# Patient Record
Sex: Female | Born: 1960 | Race: Black or African American | Hispanic: No | Smoking: Current every day smoker
Health system: Southern US, Community
[De-identification: ages and names within clinical notes are randomized; demographics above are authoritative.]

## PROBLEM LIST (undated history)

## (undated) DIAGNOSIS — F419 Anxiety disorder, unspecified: Secondary | ICD-10-CM

## (undated) DIAGNOSIS — I639 Cerebral infarction, unspecified: Secondary | ICD-10-CM

## (undated) DIAGNOSIS — I10 Essential (primary) hypertension: Secondary | ICD-10-CM

## (undated) HISTORY — PX: FRACTURE SURGERY: SHX138

## (undated) HISTORY — PX: TUBAL LIGATION: SHX77

---

## 1998-09-28 ENCOUNTER — Emergency Department (HOSPITAL_COMMUNITY): Admission: EM | Admit: 1998-09-28 | Discharge: 1998-09-28 | Payer: Self-pay | Admitting: Emergency Medicine

## 1998-11-18 ENCOUNTER — Emergency Department (HOSPITAL_COMMUNITY): Admission: EM | Admit: 1998-11-18 | Discharge: 1998-11-18 | Payer: Self-pay | Admitting: Emergency Medicine

## 1998-12-26 ENCOUNTER — Emergency Department (HOSPITAL_COMMUNITY): Admission: EM | Admit: 1998-12-26 | Discharge: 1998-12-26 | Payer: Self-pay | Admitting: Emergency Medicine

## 1999-02-16 ENCOUNTER — Emergency Department (HOSPITAL_COMMUNITY): Admission: EM | Admit: 1999-02-16 | Discharge: 1999-02-16 | Payer: Self-pay | Admitting: Emergency Medicine

## 1999-06-15 ENCOUNTER — Emergency Department (HOSPITAL_COMMUNITY): Admission: EM | Admit: 1999-06-15 | Discharge: 1999-06-15 | Payer: Self-pay | Admitting: Emergency Medicine

## 1999-08-30 ENCOUNTER — Emergency Department (HOSPITAL_COMMUNITY): Admission: EM | Admit: 1999-08-30 | Discharge: 1999-08-30 | Payer: Self-pay | Admitting: Emergency Medicine

## 1999-11-27 ENCOUNTER — Emergency Department (HOSPITAL_COMMUNITY): Admission: EM | Admit: 1999-11-27 | Discharge: 1999-11-27 | Payer: Self-pay | Admitting: *Deleted

## 1999-11-28 ENCOUNTER — Emergency Department (HOSPITAL_COMMUNITY): Admission: EM | Admit: 1999-11-28 | Discharge: 1999-11-28 | Payer: Self-pay | Admitting: Emergency Medicine

## 2000-03-05 ENCOUNTER — Emergency Department (HOSPITAL_COMMUNITY): Admission: EM | Admit: 2000-03-05 | Discharge: 2000-03-05 | Payer: Self-pay | Admitting: Emergency Medicine

## 2000-03-05 ENCOUNTER — Encounter: Payer: Self-pay | Admitting: Emergency Medicine

## 2000-05-03 ENCOUNTER — Emergency Department (HOSPITAL_COMMUNITY): Admission: EM | Admit: 2000-05-03 | Discharge: 2000-05-03 | Payer: Self-pay | Admitting: Emergency Medicine

## 2001-01-16 ENCOUNTER — Other Ambulatory Visit: Admission: RE | Admit: 2001-01-16 | Discharge: 2001-01-16 | Payer: Self-pay | Admitting: Podiatry

## 2001-09-20 ENCOUNTER — Emergency Department (HOSPITAL_COMMUNITY): Admission: EM | Admit: 2001-09-20 | Discharge: 2001-09-20 | Payer: Self-pay

## 2001-09-22 ENCOUNTER — Emergency Department (HOSPITAL_COMMUNITY): Admission: EM | Admit: 2001-09-22 | Discharge: 2001-09-22 | Payer: Self-pay | Admitting: Emergency Medicine

## 2001-11-11 ENCOUNTER — Emergency Department (HOSPITAL_COMMUNITY): Admission: EM | Admit: 2001-11-11 | Discharge: 2001-11-11 | Payer: Self-pay | Admitting: Emergency Medicine

## 2002-02-16 ENCOUNTER — Emergency Department (HOSPITAL_COMMUNITY): Admission: EM | Admit: 2002-02-16 | Discharge: 2002-02-16 | Payer: Self-pay | Admitting: Emergency Medicine

## 2002-06-13 ENCOUNTER — Emergency Department (HOSPITAL_COMMUNITY): Admission: EM | Admit: 2002-06-13 | Discharge: 2002-06-13 | Payer: Self-pay | Admitting: Emergency Medicine

## 2002-06-13 ENCOUNTER — Encounter: Payer: Self-pay | Admitting: *Deleted

## 2002-10-02 ENCOUNTER — Encounter: Payer: Self-pay | Admitting: Obstetrics

## 2002-10-02 ENCOUNTER — Ambulatory Visit (HOSPITAL_COMMUNITY): Admission: RE | Admit: 2002-10-02 | Discharge: 2002-10-02 | Payer: Self-pay | Admitting: Obstetrics

## 2002-11-10 ENCOUNTER — Emergency Department (HOSPITAL_COMMUNITY): Admission: EM | Admit: 2002-11-10 | Discharge: 2002-11-10 | Payer: Self-pay

## 2003-04-14 ENCOUNTER — Emergency Department (HOSPITAL_COMMUNITY): Admission: EM | Admit: 2003-04-14 | Discharge: 2003-04-14 | Payer: Self-pay | Admitting: Emergency Medicine

## 2003-05-20 ENCOUNTER — Emergency Department (HOSPITAL_COMMUNITY): Admission: EM | Admit: 2003-05-20 | Discharge: 2003-05-20 | Payer: Self-pay | Admitting: Emergency Medicine

## 2003-10-20 ENCOUNTER — Emergency Department (HOSPITAL_COMMUNITY): Admission: EM | Admit: 2003-10-20 | Discharge: 2003-10-20 | Payer: Self-pay | Admitting: Emergency Medicine

## 2003-11-29 ENCOUNTER — Emergency Department (HOSPITAL_COMMUNITY): Admission: EM | Admit: 2003-11-29 | Discharge: 2003-11-29 | Payer: Self-pay | Admitting: Emergency Medicine

## 2004-01-17 ENCOUNTER — Emergency Department (HOSPITAL_COMMUNITY): Admission: EM | Admit: 2004-01-17 | Discharge: 2004-01-17 | Payer: Self-pay | Admitting: Emergency Medicine

## 2004-03-22 ENCOUNTER — Emergency Department (HOSPITAL_COMMUNITY): Admission: EM | Admit: 2004-03-22 | Discharge: 2004-03-22 | Payer: Self-pay | Admitting: Emergency Medicine

## 2004-10-19 ENCOUNTER — Emergency Department (HOSPITAL_COMMUNITY): Admission: EM | Admit: 2004-10-19 | Discharge: 2004-10-19 | Payer: Self-pay | Admitting: *Deleted

## 2004-12-14 ENCOUNTER — Emergency Department (HOSPITAL_COMMUNITY): Admission: EM | Admit: 2004-12-14 | Discharge: 2004-12-14 | Payer: Self-pay | Admitting: Emergency Medicine

## 2006-07-30 ENCOUNTER — Emergency Department (HOSPITAL_COMMUNITY): Admission: EM | Admit: 2006-07-30 | Discharge: 2006-07-30 | Payer: Self-pay | Admitting: Emergency Medicine

## 2006-08-20 ENCOUNTER — Emergency Department (HOSPITAL_COMMUNITY): Admission: EM | Admit: 2006-08-20 | Discharge: 2006-08-21 | Payer: Self-pay | Admitting: Emergency Medicine

## 2006-08-23 ENCOUNTER — Emergency Department (HOSPITAL_COMMUNITY): Admission: EM | Admit: 2006-08-23 | Discharge: 2006-08-23 | Payer: Self-pay | Admitting: Family Medicine

## 2006-08-24 ENCOUNTER — Emergency Department (HOSPITAL_COMMUNITY): Admission: EM | Admit: 2006-08-24 | Discharge: 2006-08-24 | Payer: Self-pay | Admitting: Emergency Medicine

## 2006-09-26 ENCOUNTER — Emergency Department (HOSPITAL_COMMUNITY): Admission: EM | Admit: 2006-09-26 | Discharge: 2006-09-26 | Payer: Self-pay | Admitting: Emergency Medicine

## 2006-10-04 ENCOUNTER — Emergency Department (HOSPITAL_COMMUNITY): Admission: EM | Admit: 2006-10-04 | Discharge: 2006-10-04 | Payer: Self-pay | Admitting: Emergency Medicine

## 2006-10-16 ENCOUNTER — Ambulatory Visit: Payer: Self-pay | Admitting: Psychiatry

## 2006-10-17 ENCOUNTER — Inpatient Hospital Stay (HOSPITAL_COMMUNITY): Admission: RE | Admit: 2006-10-17 | Discharge: 2006-10-18 | Payer: Self-pay | Admitting: Psychiatry

## 2006-11-11 ENCOUNTER — Emergency Department (HOSPITAL_COMMUNITY): Admission: EM | Admit: 2006-11-11 | Discharge: 2006-11-12 | Payer: Self-pay | Admitting: Emergency Medicine

## 2006-11-15 ENCOUNTER — Emergency Department (HOSPITAL_COMMUNITY): Admission: EM | Admit: 2006-11-15 | Discharge: 2006-11-15 | Payer: Self-pay | Admitting: Emergency Medicine

## 2006-12-18 ENCOUNTER — Emergency Department (HOSPITAL_COMMUNITY): Admission: EM | Admit: 2006-12-18 | Discharge: 2006-12-18 | Payer: Self-pay | Admitting: Emergency Medicine

## 2007-01-16 ENCOUNTER — Encounter: Payer: Self-pay | Admitting: Emergency Medicine

## 2007-01-16 ENCOUNTER — Inpatient Hospital Stay (HOSPITAL_COMMUNITY): Admission: RE | Admit: 2007-01-16 | Discharge: 2007-01-24 | Payer: Self-pay | Admitting: Psychiatry

## 2007-01-16 ENCOUNTER — Ambulatory Visit: Payer: Self-pay | Admitting: Psychiatry

## 2007-02-22 ENCOUNTER — Emergency Department (HOSPITAL_COMMUNITY): Admission: EM | Admit: 2007-02-22 | Discharge: 2007-02-22 | Payer: Self-pay | Admitting: *Deleted

## 2007-03-08 ENCOUNTER — Emergency Department (HOSPITAL_COMMUNITY): Admission: EM | Admit: 2007-03-08 | Discharge: 2007-03-08 | Payer: Self-pay | Admitting: Emergency Medicine

## 2007-07-04 ENCOUNTER — Emergency Department (HOSPITAL_COMMUNITY): Admission: EM | Admit: 2007-07-04 | Discharge: 2007-07-04 | Payer: Self-pay | Admitting: *Deleted

## 2007-09-28 ENCOUNTER — Emergency Department (HOSPITAL_COMMUNITY): Admission: EM | Admit: 2007-09-28 | Discharge: 2007-09-28 | Payer: Self-pay | Admitting: Emergency Medicine

## 2007-10-29 ENCOUNTER — Emergency Department (HOSPITAL_COMMUNITY): Admission: EM | Admit: 2007-10-29 | Discharge: 2007-10-29 | Payer: Self-pay | Admitting: Emergency Medicine

## 2008-02-03 ENCOUNTER — Ambulatory Visit (HOSPITAL_COMMUNITY): Admission: RE | Admit: 2008-02-03 | Discharge: 2008-02-04 | Payer: Self-pay | Admitting: Orthopaedic Surgery

## 2008-04-10 ENCOUNTER — Emergency Department (HOSPITAL_COMMUNITY): Admission: EM | Admit: 2008-04-10 | Discharge: 2008-04-10 | Payer: Self-pay | Admitting: Emergency Medicine

## 2008-05-18 ENCOUNTER — Emergency Department (HOSPITAL_COMMUNITY): Admission: EM | Admit: 2008-05-18 | Discharge: 2008-05-18 | Payer: Self-pay | Admitting: Emergency Medicine

## 2008-06-06 IMAGING — CR DG CERVICAL SPINE COMPLETE 4+V
7 series · 7 of 7 positions shown · non-contrast
Comparison: none

CLINICAL DATA: Fall, back pain

CERVICAL SPINE - 5  VIEW:

[view not recorded (1 of 7)]
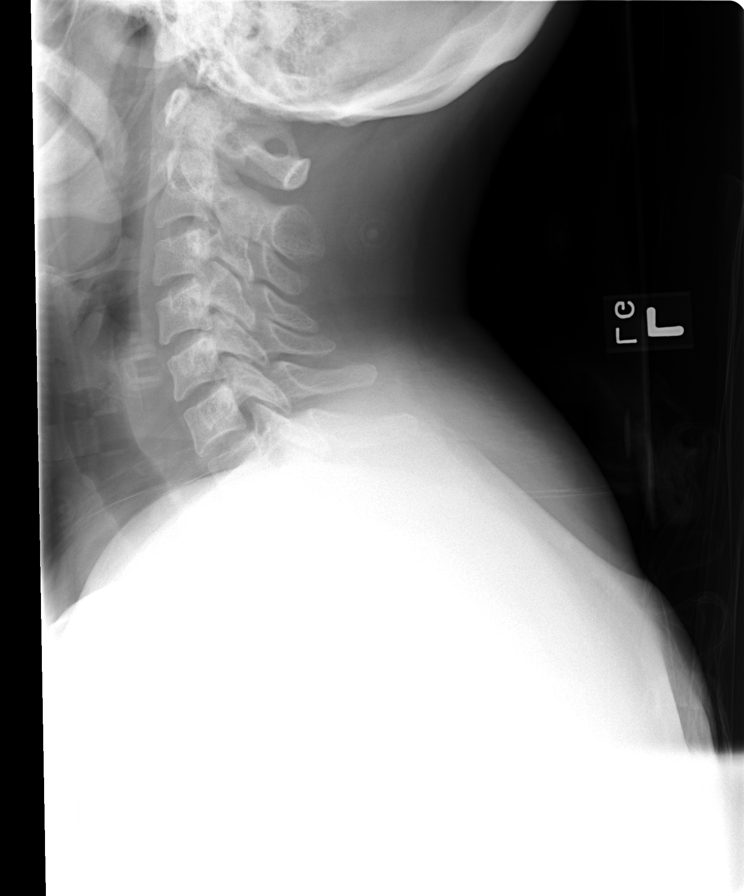

[view not recorded (2 of 7)]
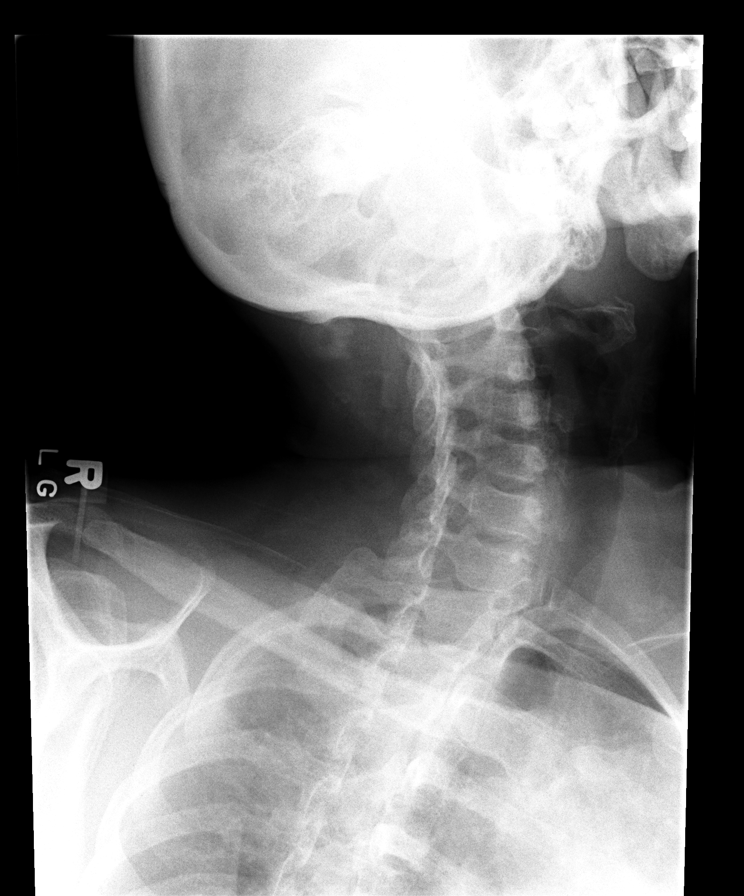

[view not recorded (3 of 7)]
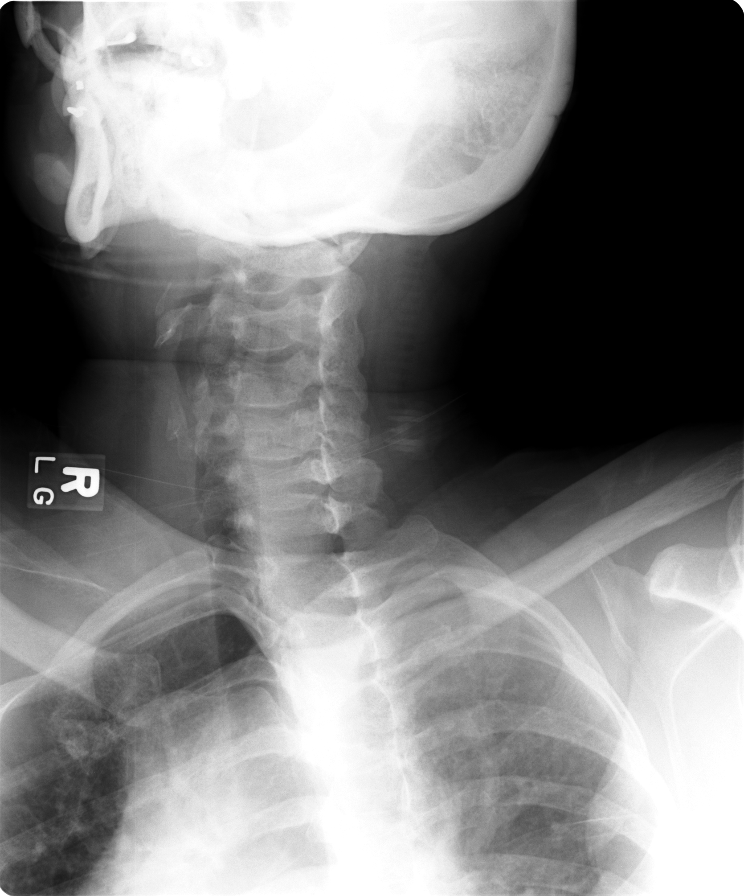

[view not recorded (4 of 7)]
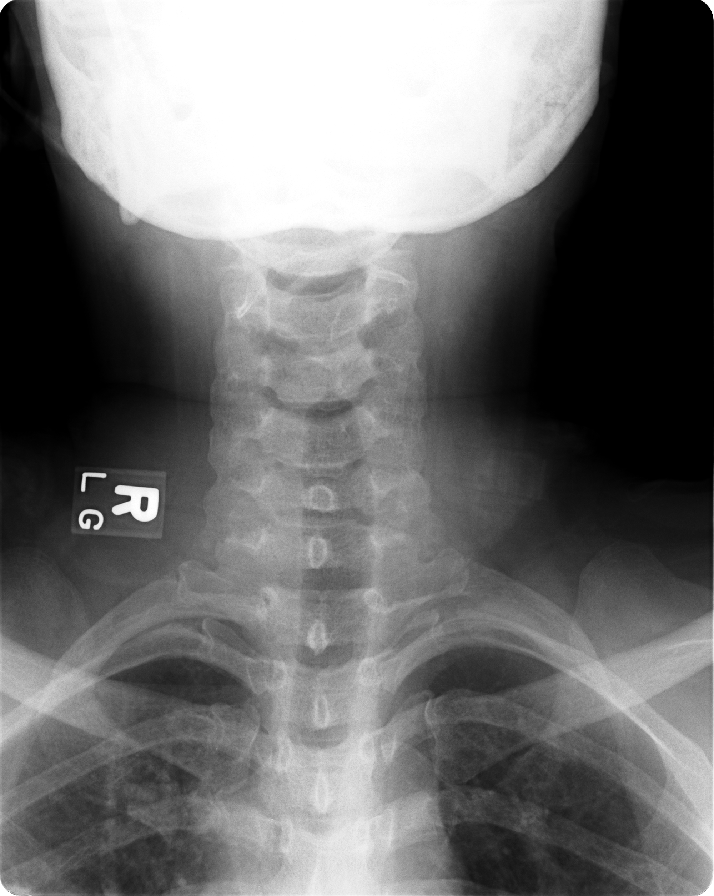

[view not recorded (5 of 7)]
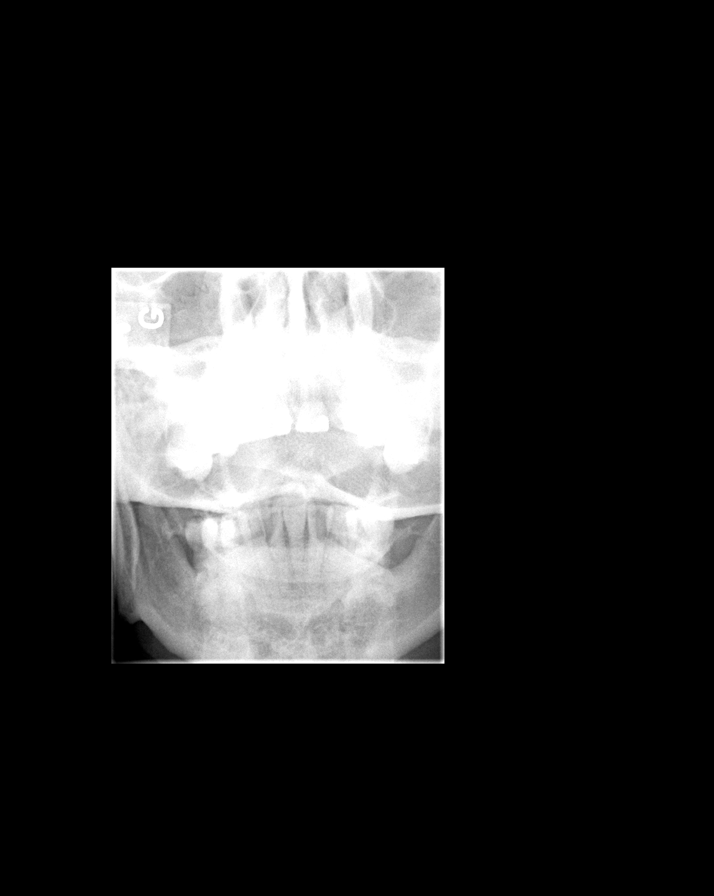

[view not recorded (6 of 7)]
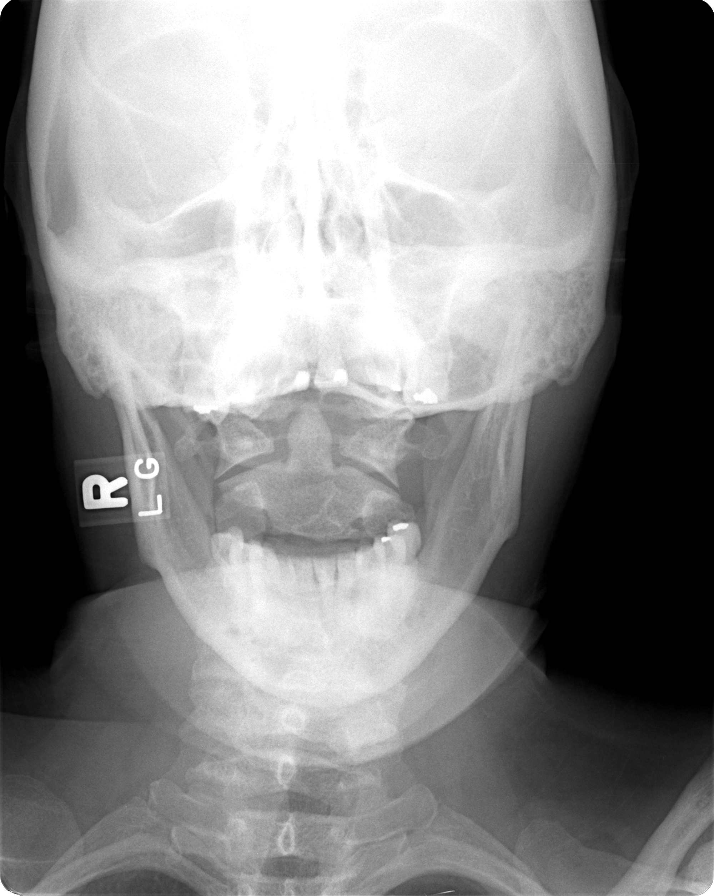

[view not recorded (7 of 7)]
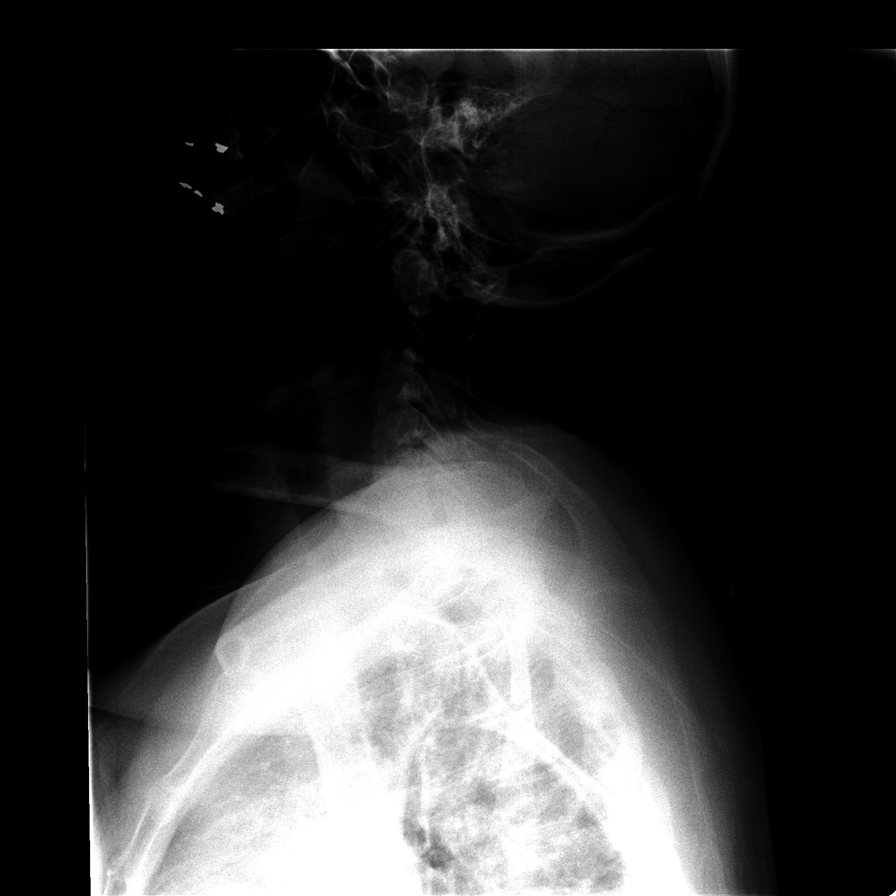

[7 of 7 positions shown; findings below may reference images not displayed]

FINDINGS: There is no evidence of cervical spine fracture or prevertebral soft
tissue swelling.  Alignment is normal.  No other significant bone abnormalities
are identified.
IMPRESSION: Negative cervical spine radiographs.

## 2008-08-23 IMAGING — CR DG CHEST 1V PORT
1 series · 1 of 1 positions shown · non-contrast
Comparison: 12/14/04.

CLINICAL DATA: Shortness of breath, chest pain.  
 PORTABLE CHEST - 1 VIEW ? 10/16/06 (2826 HOURS):

[view not recorded]
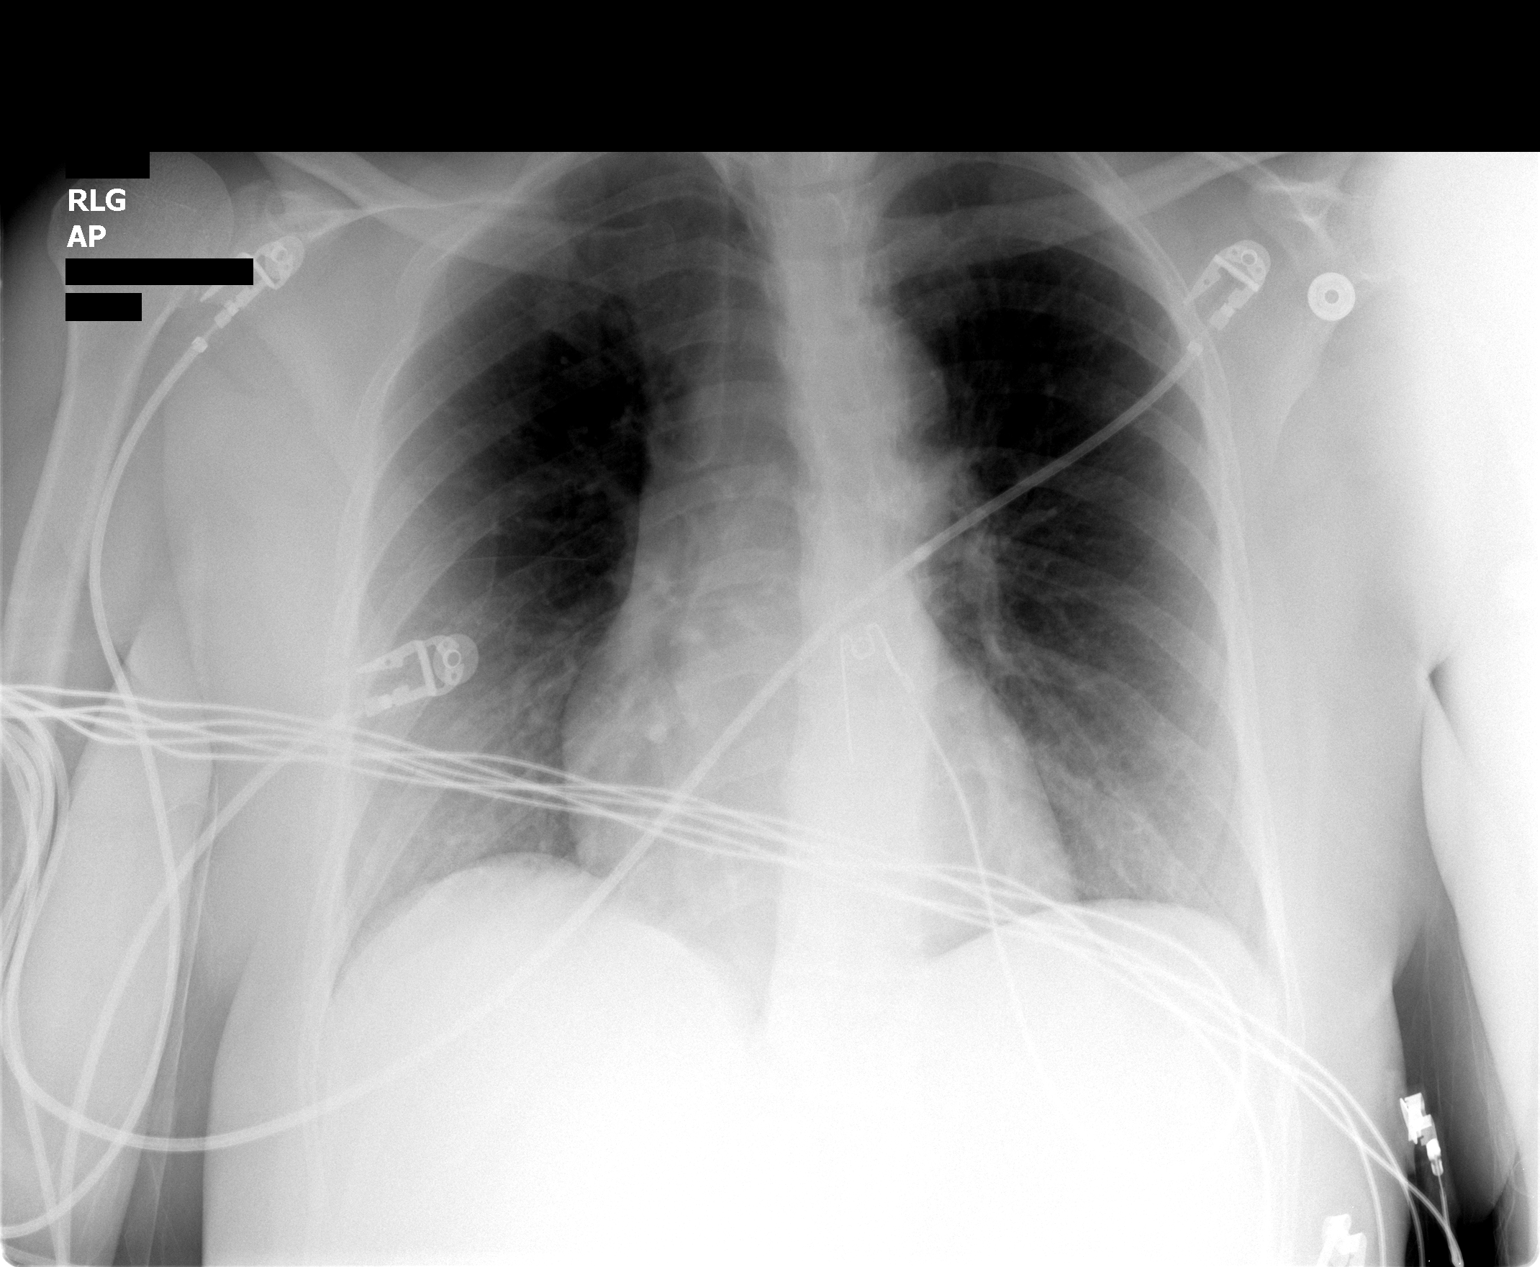

[1 of 1 positions shown; findings below may reference images not displayed]

FINDINGS: Heart size is normal.   There is no heart failure.  The lungs are clear and there is no infiltrate.
IMPRESSION: No acute abnormality.

## 2009-06-27 ENCOUNTER — Encounter: Admission: RE | Admit: 2009-06-27 | Discharge: 2009-06-27 | Payer: Self-pay | Admitting: Obstetrics and Gynecology

## 2009-11-13 ENCOUNTER — Emergency Department (HOSPITAL_COMMUNITY): Admission: EM | Admit: 2009-11-13 | Discharge: 2009-11-13 | Payer: Self-pay | Admitting: Emergency Medicine

## 2010-08-17 ENCOUNTER — Emergency Department (HOSPITAL_COMMUNITY)
Admission: EM | Admit: 2010-08-17 | Discharge: 2010-08-17 | Payer: Self-pay | Source: Home / Self Care | Admitting: Emergency Medicine

## 2010-10-02 ENCOUNTER — Encounter: Payer: Self-pay | Admitting: Internal Medicine

## 2011-01-07 ENCOUNTER — Emergency Department (HOSPITAL_COMMUNITY)
Admission: EM | Admit: 2011-01-07 | Discharge: 2011-01-07 | Disposition: A | Discharge status: Home / Self Care | Payer: Medicaid Other | Attending: Emergency Medicine | Admitting: Emergency Medicine

## 2011-01-07 DIAGNOSIS — F411 Generalized anxiety disorder: Secondary | ICD-10-CM | POA: Insufficient documentation

## 2011-01-07 DIAGNOSIS — I10 Essential (primary) hypertension: Secondary | ICD-10-CM | POA: Insufficient documentation

## 2011-01-23 NOTE — Discharge Summary (Signed)
Amanda Moody, MCCOLM                  ACCOUNT NO.:  [id number]   MEDICAL RECORD NO.:  [id number]          PATIENT TYPE:  IPS   LOCATION:  0504                          [facility]   PHYSICIAN:  Geoffery Lyons, M.D.      DATE OF BIRTH:  1961/02/26   DATE OF ADMISSION:  01/16/2007  DATE OF DISCHARGE:  01/24/2007                               DISCHARGE SUMMARY   CHIEF COMPLAINT/HISTORY OF PRESENT ILLNESS:  This is the second  admission to [facility] for this 50 year old African-  American female being voluntarily admitted.  She presented to the  emergency room after having slipped while stepping from the house at  home.  She fell and fractured her left forearm.  In the ED, she revealed  that she was feeling very depressed, suicidal and contemplating hurting  herself when she was thinking about the upcoming Mother's Day weekend.  She was depressed and ashamed of recently relapsing on heroin.  Snorted  one bag or two the day before after being abstinent for 14 days and  before that using two bags daily for four months.  She was uses some  alcohol rarely.  She feels hopeless about being able to maintain level  of abstinence.  She claimed that the relapse was in part triggered being  exposed to a lot of drug use where she lives.   The history of her second admission to [facility] was  that she last admitted October 17, 2006 through October 18, 2006 for  detox.  She endorsed a history of depressed mood and auditory  hallucinations upon this admission, having voices telling her that she  should go ahead and die.  She had taken Prozac 20 mg per day and took  Klonopin the distant past.  In the emergency room, she was given Geodon  20 mg IM.  She has also been on Risperdal.   ALCOHOL AND DRUG HISTORY:  She has a long history of opiate dependence  with history of detox in [place].  Her longest period of abstinence was  after detox in [place] which was 30 days.   She was most productive and  did the best on methadone maintenance.   MEDICAL HISTORY:  1. 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  2. High blood pressure.   MEDICATION:  1. Geodon 20 mg IM given in the ED.  2. Prozac 20 mg per day, recent.  3. Methadone between 50 and 100 mg per day in the various programs      when she was on methadone maintenance.   PHYSICAL EXAMINATION:  Compatible for a crusty lesion on the upper  auricle of her right ear and for fracture of left arm.   LABORATORY WORK:  SGOT 23, SGPT 24, total bilirubin 0.3.  TSH 1.018.  UDS positive for opiates and cocaine.  Sodium 136, potassium 4, BUN 12,  creatinine is not available.   NEUROLOGIC EXAMINATION:  This is a fully alert female who is cooperative  and pleasant whose mood is depressed and affect is depressed.  Her  speech is normal production pace and tone, somewhat soft and a little  slow but adequately articulate and fluent.  Her thought process endorses  suicidal ruminations, thinking about cutting herself and some voices  telling her to die.  Cognition is well-preserved.   AXIS I:  Major depression with psychotic features.  Opiate dependence.  Cocaine abuse.  AXIS II:  No diagnosis.  AXIS II:  Fractured left ulna, nonunion fracture.  External ear wound.  AXIS IV:  Moderate.  AXIS V:  Global assessment of functioning on admission was 35 and global  assessment of functioning last year was 60.   COURSE IN THE HOSPITAL:  She was admitted and started individual  psychotherapy.  She was given trazodone for sleep.  She was given some  Risperdal and some Ativan.  She was placed on Percocet due to the acute  pain and she was also given some Neurontin.  Her past medications  include Prozac, some methadone and Klonopin.  As already stated, she  endorsed that she had been on disability for bipolar disorder.  She has  two years in college but was unable to function and disappointed in  herself due to the use,  even avoiding house and people using.  She stays  with the mother on and off.  She cannot stay clean where she is at.  Abstinent for 14 days, she did relapsed on heroin two bags, snorting.  She is down on self, out of it and suicidal.  She did use some cocaine  and some alcohol, couple of drinks.  She endorses voices when she is  depressed.  The voices tell her to just kill herself.  Her past history  as already stated, previous detox __________ in [place].  She continued  with the pain.  She had a hard time with the nicotine withdrawal.  Her  mother was willing to allow her to stay in the house if she was in the  methadone program.   We worked on pain management and relapse prevention.  We also worked her  other comorbidities.  On 01/19/2007, she was having a hard time with  mood swings, mood lability, pain, very irritable, unable to deal with  group, easily agitated easily and easily overwhelmed.  She very labile.  She had some verbal altercations with a couple of peers and then  tearful, asking why people were mean.  She verbalized __________ of the  mood swings.  She was hearing voices in the morning.   On 01/20/2007, she had a very hard time with the pain, with the mood  swings and the anxiety.  She was having a hard time tolerating other  patients' behavior.  She endorsed she was tired of feeling this way and  wanted to change thins around.  She wanted to feel better.  She was  upset that her life has not turned out the way she expected it to be.  She continued to have the mood swings and anxiety, building up to  hyperventilating but did respond to intervention from staff.  We  increased the Neurontin.  We worked on Pharmacologist, __________  prevention.  We worked with some Seroquel.  She experienced anxiety and  the mood fluctuation and lability.  She was having a hard time.  She claimed that the staff was not giving her pain pills sometimes.  She was  very irritable.  At the  same time, she was able to commit to working on  her recovery.  She could not tolerate delays of the nursing staff giving  her medications.   By 01/24/2007, she was better.  She had tolerated the medication well  and seemed to be helping.  Her mood was more euthymic and affect was  more contained, within normal limits.  There was some marked decrease in  the lability.  She was not suicidal or homicidal and was not hearing  voices, so she was discharged for outpatient followup.   AXIS I:  Mood disorder, not otherwise specified, with psychotic  features.  Opiate dependence.  Cocaine abuse.  Alcohol abuse.  AXIS II:  No diagnosis.  AXIS III:  Fractured left ulnar bone.  AXIS IV:  Moderate.  AXIS V:  On discharge, 50.   DISCHARGE MEDICATIONS:  1. Prozac 20 mg per day.  2. Seroquel 100 three times a day and at bedtime.  3. Neurontin 400 three times a day and at bedtime.  4. Percocet 5/325 two tabs every 4 hours as needed for pain.  5. Trazodone 100 one-half to one at bedtime as needed for sleep.   FOLLOWUP:  She is going to followup with a [facility].      Geoffery Lyons, M.D.  Electronically Signed     IL/MEDQ  D:  02/18/2007  T:  02/19/2007  Job:  [id number]

## 2011-01-23 NOTE — Op Note (Signed)
NAMEARDELL, AARONSON                  ACCOUNT NO.:  [id number]   MEDICAL RECORD NO.:  [id number]          PATIENT TYPE:  OIB   LOCATION:  5524                         FACILITY:  [facility]   PHYSICIAN:  Vanita Panda. 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   PROCEDURE:  1. Takedown of left ulnar shaft nonunion.  2. Open reduction and internal fixation of left ulnar fracture with      plate screws and supplemental bone graft.   SURGEON:  Vanita Panda. Magnus Ivan, MD   ANESTHESIA:  General.   BLOOD LOSS:  Minimal.   TOURNIQUET TIME:  One hour 40 minutes.   ANTIBIOTICS:  A 2 grams of IV Ancef.   COMPLICATIONS:  None.   INDICATIONS:  Briefly, Ms. Manseau is a 50 year old female who over a year  ago sustained an ulnar shaft fracture of her left nondominant ulna.  I  originally set her up for surgery over a year ago because I felt this  has developed into a nonunion.  She was unable to clear anesthesia from  a surgery standpoint due to severe hypertension.  She also has a history  of cocaine abuse and did require hospitalization to the psychiatric unit  due to these things.  I tried multiple attempts to get her to [facility] and to be seen by physicians, which was difficult for her and I  believed there were some compliant issues as well.  She was able to  eventually get her life straighten out and her blood pressure has been  under excellent control.  Due to considerable pain and obvious motion of  the fracture site, it was recommended that she undergo takedown of the  nonunion and open reduction and internal fixation with plating.  She  understood the risks and benefits of this extensively and did agreed to  proceed with  surgery.   PROCEDURE DESCRIPTION:  After informed consent was obtained, appropriate  left arm was marked.  Ms. Kawahara was brought to the operating room and  placed supine on the operating table.  General anesthesia was then  obtained.  An arm tape was then placed, which was radiolucent, and a  nonsterile tourniquet was placed on her upper left arm.  A time-out was  called, and she was identified as the correct patient and correct left  upper extremity.  Her arm had already been prepped and draped with  DuraPrep and sterile drapes.  An Esmarch was used to wrap out the left  arm and the tourniquet was inflated to 250 mL of pressure.  I then made  an incision directly over the ulnar border of the forearm directly over  the fracture site, which was obvious visual and palpable defect.  I  carried this directly and dried down to the fracture site and you could  see she had a hypertrophic nonunion.  There was abundant motion of the  fracture site.  There was no bone growth at all.  I took down the  fracture using a rongeur and curettes to get the bleeding bone on both  ends.  I then tried to get the hands to align and to proper rotation.  There was obviously going to be a gap at the fracture site itself.  I  then chose a DePuy nine-hole 3.5 mm compression plate and placed this  along the dorsal border of the ulna to avoid plate prominence.  I then  secured this distally and proximally and placed two compression screws  to try to compress the fracture down.  I got minimal compression and  there was still significant gap remaining.  I then used a bicortical  locking screws for a total of four screws in the proximal fragment and  four screws in the distal fragment.  After irrigated the wound  copiously, I then used a VITUS bone filling as graft substitute and  packed this into the fracture site itself for hopefully some  osteoinductive and osteoconductive properties.  I then closed the deep  tissue  over the plate with interrupted 0-Vicryl followed by 2-0 Vicryl,  the subcutaneous tissue interrupted 3-0 nylon on the skin.  Xeroform  followed by well-padded sterile dressing and a plaster splint were then  applied on the arm.  The tourniquet was let down at 1 hour and 40  minutes and the fingers did pink nicely.  The patient was then awakened,  extubated, and  taken to the recovery room in stable condition.  All  final counts were correct and there were no complications noted.      Vanita Panda. Magnus Ivan, M.D.  Electronically Signed     CYB/MEDQ  D:  02/03/2008  T:  02/04/2008  Job:  [id number]
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
## 2011-01-26 NOTE — Discharge Summary (Signed)
NAMECASSANDRA, HARBOLD                  ACCOUNT NO.:  [id number]   MEDICAL RECORD NO.:  [id number]          PATIENT TYPE:  IPS   LOCATION:  0303                          [facility]ILITY[facility]  [facility]   PHYSICIAN:  Anselm Jungling, MD  DATE OF BIRTH:  Apr 27, 1961   DATE OF ADMISSION:  10/16/2006  DATE OF DISCHARGE:  10/18/2006                               DISCHARGE SUMMARY   IDENTIFYING DATA/REASON FOR ADMISSION:  This was an inpatient  psychiatric admission for Amanda Moody, a 50 year old divorced female who was  admitted with a history of opiate abuse.  She had been abusing heroin  for six weeks, especially at times that she had run out of methadone,  that she had been getting [facility].  Because of her drug-abusing indiscretions, she was banned from  the methadone clinic.  This was part of her motivation and __________  for coming in for treatment.  Please refer to the admission note for  further details pertaining to the symptoms, circumstances and history  that led to her hospitalization.   INITIAL DIAGNOSTIC IMPRESSION:  She was given an initial AXIS I  diagnoses of opiate dependence, chronic.   MEDICAL/LABORATORY:  The patient was medically and physically assessed  by the psychiatric nurse practitioner.  She had a history of chronic  pain, and recent right arm fracture.  She had a history of seizures  three years prior to admission, which were presumed to be alcoholism  based.  There were no acute medical issues during this brief inpatient  psychiatric stay.   HOSPITAL COURSE:  The patient was admitted to the adult inpatient  psychiatric service.  She presented as a normally developed adult female  who was alert, oriented, and absent any signs of psychosis or thought  disorder.  She was placed on a detoxification protocol.   On the second full hospital day, the patient had discussion with the  undersigned regarding management of her medications and  withdrawal.  She  was requesting benzodiazepine medication as well as pain medication of  an opiate basis, and the patient was having great difficulty accepting  or concurring with my opinion that these would be medications that were  inappropriate for her, given her current situation, that is being in a  process of detoxification from heroin and methadone.   The patient did agree to a retrial of Prozac, which had been helpful to  her in the past.   The patient requested discharge on the third hospital day.  It was  pointed out to her that she did not appear to be out of the woods with  respect to opiate detoxification.  She indicated that she accepted that  but had various personal business to attend to and did not wish to stay  for further treatment.   AFTERCARE:  The patient was referred to the [facility] for further  services, including medication management, and substance abuse  counseling.   DISCHARGE MEDICATIONS:  Prozac 20 mg daily.   DISCHARGE DIAGNOSES:  AXIS I:  Polysubstance abuse.  Mood disorder  not  otherwise specified.  AXIS II:  Deferred.  AXIS III:  Recent arm fracture, history of seizures.  AXIS IV:  Stressors:  Severe.  AXIS V:  GAF on discharge 60.      Anselm Jungling, MD  Electronically Signed     SPB/MEDQ  D:  10/27/2006  T:  10/27/2006  Job:  [id number]

## 2011-06-01 LAB — URINE CULTURE: Colony Count: 25000

## 2011-06-01 LAB — URINALYSIS, ROUTINE W REFLEX MICROSCOPIC
Bilirubin Urine: NEGATIVE
Hgb urine dipstick: NEGATIVE
Ketones, ur: NEGATIVE
Nitrite: NEGATIVE
Specific Gravity, Urine: 1.013

## 2011-06-01 LAB — URINE MICROSCOPIC-ADD ON

## 2011-06-06 LAB — BASIC METABOLIC PANEL
BUN: 8
CO2: 26
Calcium: 9.7
Chloride: 104
Creatinine, Ser: 0.87

## 2011-06-06 LAB — CBC
MCHC: 33.7
MCV: 85.7
Platelets: 263

## 2011-06-06 LAB — CK TOTAL AND CKMB (NOT AT ARMC): Total CK: 245 — ABNORMAL HIGH

## 2011-08-08 ENCOUNTER — Encounter: Payer: Self-pay | Admitting: *Deleted

## 2011-08-08 ENCOUNTER — Emergency Department (HOSPITAL_COMMUNITY)
Admission: EM | Admit: 2011-08-08 | Discharge: 2011-08-08 | Disposition: A | Discharge status: Home / Self Care | Payer: Medicaid Other | Attending: Emergency Medicine | Admitting: Emergency Medicine

## 2011-08-08 DIAGNOSIS — H9209 Otalgia, unspecified ear: Secondary | ICD-10-CM | POA: Insufficient documentation

## 2011-08-08 DIAGNOSIS — IMO0002 Reserved for concepts with insufficient information to code with codable children: Secondary | ICD-10-CM | POA: Insufficient documentation

## 2011-08-08 DIAGNOSIS — T169XXA Foreign body in ear, unspecified ear, initial encounter: Secondary | ICD-10-CM | POA: Insufficient documentation

## 2011-08-08 NOTE — ED Provider Notes (Signed)
Medical screening examination/treatment/procedure(s) were performed by non-physician practitioner and as supervising physician I was immediately available for consultation/collaboration.  Raeford Razor, MD 08/08/11 [phone]

## 2011-08-08 NOTE — ED Notes (Signed)
Patient states that cotton swab was inserted in ear 4-6 days ago.

## 2011-08-08 NOTE — ED Notes (Signed)
PA notified of FB removal.

## 2011-08-08 NOTE — ED Notes (Signed)
C/o L earache, "got q-tip stuck in ear 4-5 d ago", pain onset 2d ago, (denies: drainage or other sx), tried to get out with tweezers, also used peroxide. Rates pain 4/10.

## 2011-08-08 NOTE — ED Provider Notes (Addendum)
History     CSN: [id number] Arrival date & time: 08/08/2011  6:20 AM   None     Chief Complaint  Patient presents with  . Otalgia    (Consider location/radiation/quality/duration/timing/severity/associated sxs/prior treatment) HPI  Patient presents to emergency department complaining of a 2 day history of left ear discomfort stating that she was cleaning her ears with a Q-tip about 4-5 days ago and believes that part of the cotton end is stuck in her ear noting more discomfort approximately 2 days ago and therefore attempted to remove cotton with tweezers and peroxide but is unable to remove the cotton from her ear. Patient states pain is more "of a discomfort" and less of a "serious pain" and therefore states she has not taken anything for pain prior to arrival. Patient has no other complaints. She denies headache, dizziness, nausea, vomiting, visual changes, fevers, chills, hearing changes. Symptoms were gradual onset, and unchanging. Denies aggravating or alleviating factors.  History reviewed. No pertinent past medical history.  Past Surgical History  Procedure Date  . Fracture surgery   . Tubal ligation     History reviewed. No pertinent family history.  History  Substance Use Topics  . Smoking status: Current Everyday Smoker  . Smokeless tobacco: Not on file  . Alcohol Use: No    OB History    Grav Para Term Preterm Abortions TAB SAB Ect Mult Living                  Review of Systems  All other systems reviewed and are negative.    Allergies  Flexeril  Home Medications   Current Outpatient Rx  Name Route Sig Dispense Refill  . FLUOXETINE HCL 40 MG PO CAPS Oral Take 40 mg by mouth daily.      Marland Kitchen LISINOPRIL 20 MG PO TABS Oral Take 20 mg by mouth daily.        BP 129/86  Pulse 76  Temp(Src) 98.2 F (36.8 C) (Oral)  Resp 18  SpO2 95%  Physical Exam  Nursing note and vitals reviewed. Constitutional: She is oriented to person, place, and time. She  appears well-developed and well-nourished. No distress.  HENT:  Head: Normocephalic and atraumatic.  Right Ear: External ear normal.  Mouth/Throat: Oropharynx is clear and moist.       Cotton appearing FB deep in external canal. No abnormality on visible portion of TM.  Eyes: Conjunctivae are normal.  Neck: Normal range of motion. Neck supple.  Cardiovascular: Normal rate.   Pulmonary/Chest: Effort normal.  Musculoskeletal: Normal range of motion.  Neurological: She is alert and oriented to person, place, and time.  Skin: Skin is warm and dry. She is not diaphoretic.    ED Course  Procedures (including critical care time)  Left ear irrigation to remove FB.  7:05 AM Cotton FB removed from left ear with irrigation with discomfort sensation resolved. TM of left ear intact without abnormality.   Labs Reviewed - No data to display No results found.   1. Foreign body in ear       MDM  FB removed and normal TM post irrigation.         Jenness Corner, PA 08/08/11 0706  Jenness Corner, PA 08/11/11 [phone]

## 2011-08-11 NOTE — ED Provider Notes (Signed)
Medical screening examination/treatment/procedure(s) were performed by non-physician practitioner and as supervising physician I was immediately available for consultation/collaboration.  Raeford Razor, MD 08/11/11 [phone]

## 2012-07-03 ENCOUNTER — Ambulatory Visit: Payer: Medicaid Other | Admitting: Gastroenterology

## 2012-11-04 ENCOUNTER — Other Ambulatory Visit: Payer: Self-pay | Admitting: Internal Medicine

## 2012-11-04 ENCOUNTER — Other Ambulatory Visit (HOSPITAL_COMMUNITY): Payer: Self-pay | Admitting: Internal Medicine

## 2012-11-04 DIAGNOSIS — Z1231 Encounter for screening mammogram for malignant neoplasm of breast: Secondary | ICD-10-CM

## 2012-12-04 ENCOUNTER — Ambulatory Visit: Payer: Medicaid Other

## 2013-11-23 DIAGNOSIS — I10 Essential (primary) hypertension: Secondary | ICD-10-CM | POA: Diagnosis present

## 2015-01-14 ENCOUNTER — Encounter (HOSPITAL_COMMUNITY): Payer: Self-pay | Admitting: Emergency Medicine

## 2015-01-14 ENCOUNTER — Emergency Department (HOSPITAL_COMMUNITY)
Admission: EM | Admit: 2015-01-14 | Discharge: 2015-01-14 | Disposition: A | Discharge status: Home / Self Care | Payer: Medicaid Other | Attending: Emergency Medicine | Admitting: Emergency Medicine

## 2015-01-14 ENCOUNTER — Emergency Department (HOSPITAL_COMMUNITY): Payer: Medicaid Other

## 2015-01-14 DIAGNOSIS — H538 Other visual disturbances: Secondary | ICD-10-CM | POA: Diagnosis not present

## 2015-01-14 DIAGNOSIS — R42 Dizziness and giddiness: Secondary | ICD-10-CM | POA: Diagnosis present

## 2015-01-14 DIAGNOSIS — Z720 Tobacco use: Secondary | ICD-10-CM | POA: Diagnosis not present

## 2015-01-14 DIAGNOSIS — Z79899 Other long term (current) drug therapy: Secondary | ICD-10-CM | POA: Diagnosis not present

## 2015-01-14 LAB — CBC WITH DIFFERENTIAL/PLATELET
BASOS PCT: 0 % (ref 0–1)
Basophils Absolute: 0 10*3/uL (ref 0.0–0.1)
EOS ABS: 0.1 10*3/uL (ref 0.0–0.7)
EOS PCT: 1 % (ref 0–5)
HEMATOCRIT: 37.2 % (ref 36.0–46.0)
HEMOGLOBIN: 12.5 g/dL (ref 12.0–15.0)
LYMPHS ABS: 3.1 10*3/uL (ref 0.7–4.0)
Lymphocytes Relative: 52 % — ABNORMAL HIGH (ref 12–46)
MCH: 31.8 pg (ref 26.0–34.0)
MCHC: 33.6 g/dL (ref 30.0–36.0)
MCV: 94.7 fL (ref 78.0–100.0)
MONO ABS: 0.4 10*3/uL (ref 0.1–1.0)
MONOS PCT: 7 % (ref 3–12)
Neutro Abs: 2.3 10*3/uL (ref 1.7–7.7)
Neutrophils Relative %: 40 % — ABNORMAL LOW (ref 43–77)
Platelets: 213 10*3/uL (ref 150–400)
RBC: 3.93 MIL/uL (ref 3.87–5.11)
RDW: 13.2 % (ref 11.5–15.5)
WBC: 5.9 10*3/uL (ref 4.0–10.5)

## 2015-01-14 LAB — BASIC METABOLIC PANEL
Anion gap: 9 (ref 5–15)
BUN: 10 mg/dL (ref 6–20)
CHLORIDE: 102 mmol/L (ref 101–111)
CO2: 31 mmol/L (ref 22–32)
Calcium: 9.8 mg/dL (ref 8.9–10.3)
Creatinine, Ser: 1.24 mg/dL — ABNORMAL HIGH (ref 0.44–1.00)
GFR calc non Af Amer: 48 mL/min — ABNORMAL LOW (ref >60)
GFR, EST AFRICAN AMERICAN: 55 mL/min — AB (ref >60)
GLUCOSE: 104 mg/dL — AB (ref 70–99)
POTASSIUM: 3.4 mmol/L — AB (ref 3.5–5.1)
Sodium: 142 mmol/L (ref 135–145)

## 2015-01-14 LAB — I-STAT TROPONIN, ED: TROPONIN I, POC: 0 ng/mL (ref 0.00–0.08)

## 2015-01-14 MED ORDER — SODIUM CHLORIDE 0.9 % IV BOLUS (SEPSIS): 1000.0000 mL | Freq: Once | INTRAVENOUS | Status: DC

## 2015-01-14 MED ORDER — MECLIZINE HCL 12.5 MG PO TABS: 12.5000 mg | ORAL_TABLET | Freq: Three times a day (TID) | ORAL | Status: DC | PRN

## 2015-01-14 MED ORDER — MECLIZINE HCL 25 MG PO TABS: 25.0000 mg | ORAL_TABLET | Freq: Once | ORAL | Status: DC

## 2015-01-14 MED ORDER — LORAZEPAM 2 MG/ML IJ SOLN
1.0000 mg | Freq: Once | INTRAMUSCULAR | Status: DC
  Filled 2015-01-14: qty 1

## 2015-01-14 NOTE — ED Notes (Signed)
Pt in MRI.

## 2015-01-14 NOTE — ED Notes (Signed)
Ambulated pt in the hallway pt states that she feels dizzy no other complaints noted at this time

## 2015-01-14 NOTE — Discharge Instructions (Signed)

## 2015-01-14 NOTE — ED Notes (Signed)
Pt c/o dizziness and blurry vision x 2 days; pt denies other complaints; pt sts difficult with balance

## 2015-01-14 NOTE — ED Provider Notes (Signed)
Pt refusing MRI. Offered ativan. Continues to refuse MRI. Further refused antivert, IVF. Pt has gotten herself dressed and wants to leave right now with her family. Pt aware of risks. Left AMA.    Samuel JesterKathleen Shaylynn Nulty, DO 01/14/15 2019

## 2015-01-14 NOTE — ED Notes (Signed)
Pt refusing to have MRI done, refusing IV anxiety medication to assist with procedure. RN and MD explained to pt the importance of MRI to and rule out disease processes. Pt stating "I am not having procedure and I am not leaving AMA". Pt left with family.

## 2015-01-14 NOTE — ED Notes (Signed)
Pt refusing to have MRI done. Pt hysterical and tearful about MRI. Offered to give pt medication to help calm her down. Pt refused saying "I don't want to have this done".

## 2015-01-14 NOTE — ED Provider Notes (Signed)
CSN: [id number]     Arrival date & time 01/14/15  1211 History   First MD Initiated Contact with Patient 01/14/15 1339     Chief Complaint  Patient presents with  . Dizziness  . Blurred Vision     (Consider location/radiation/quality/duration/timing/severity/associated sxs/prior Treatment) HPI Comments: Patient reports feeling off balance and dizziness with blurry vision since yesterday morning. States she feels like she was flung to the right and have trouble walking. Denies any difficulty speaking or swallowing. No focal weakness, numbness or tingling. She feels like her vision is a bit more blurry than usual. She normally wears reading glasses which she is not wearing now. Denies any eye pain. Denies any chest pain or shortness of breath. Denies any abdominal pain, nausea or vomiting. She has a mild headache which is typical for her. She has a history of hypertension and states compliance with her medications.  The history is provided by the patient.    History reviewed. No pertinent past medical history. Past Surgical History  Procedure Laterality Date  . Fracture surgery    . Tubal ligation     History reviewed. No pertinent family history. History  Substance Use Topics  . Smoking status: Current Every Day Smoker  . Smokeless tobacco: Not on file  . Alcohol Use: No   OB History    No data available     Review of Systems  Constitutional: Negative for fever, activity change and appetite change.  HENT: Negative for congestion and rhinorrhea.   Eyes: Positive for visual disturbance.  Respiratory: Negative for cough, chest tightness and shortness of breath.   Cardiovascular: Negative for chest pain.  Gastrointestinal: Negative for nausea, vomiting and abdominal pain.  Genitourinary: Negative for dysuria and hematuria.  Musculoskeletal: Positive for gait problem.  Skin: Negative for rash.  Neurological: Positive for dizziness and light-headedness. Negative for weakness and  headaches.  A complete 10 system review of systems was obtained and all systems are negative except as noted in the HPI and PMH.      Allergies  Flexeril  Home Medications   Prior to Admission medications   Medication Sig Start Date End Date Taking? Authorizing Provider  FLUoxetine (PROZAC) 20 MG capsule Take 20 mg by mouth daily. 11/27/14  Yes Historical Provider, MD  lisinopril-hydrochlorothiazide (PRINZIDE,ZESTORETIC) 20-25 MG per tablet Take 1 tablet by mouth daily. 12/26/14  Yes Historical Provider, MD  lamoTRIgine (LAMICTAL) 200 MG tablet Take 200 mg by mouth daily. 12/26/14   Historical Provider, MD  meclizine (ANTIVERT) 12.5 MG tablet Take 1 tablet (12.5 mg total) by mouth 3 (three) times daily as needed for dizziness. 01/14/15   Glynn OctaveStephen Moshe Wenger, MD   BP 111/64 mmHg  Pulse 50  Temp(Src) 98 F (36.7 C) (Oral)  Resp 17  SpO2 97% Physical Exam  Constitutional: She is oriented to person, place, and time. She appears well-developed and well-nourished. No distress.  HENT:  Head: Normocephalic and atraumatic.  Mouth/Throat: Oropharynx is clear and moist. No oropharyngeal exudate.  Eyes: Conjunctivae and EOM are normal. Pupils are equal, round, and reactive to light.  Neck: Normal range of motion. Neck supple.  No meningismus.  Cardiovascular: Normal rate, regular rhythm, normal heart sounds and intact distal pulses.   No murmur heard. Pulmonary/Chest: Effort normal and breath sounds normal. No respiratory distress.  Abdominal: Soft. There is no tenderness. There is no rebound and no guarding.  Musculoskeletal: Normal range of motion. She exhibits no edema or tenderness.  Neurological: She is alert and  oriented to person, place, and time. No cranial nerve deficit. She exhibits normal muscle tone. Coordination normal.  No ataxia on finger to nose bilaterally. No pronator drift. 5/5 strength throughout. CN 2-12 intact. positive Romberg. Equal grip strength. Sensation intact. Gait is  ataxic. No nystagmus  Skin: Skin is warm.  Psychiatric: She has a normal mood and affect. Her behavior is normal.  Nursing note and vitals reviewed.   ED Course  Procedures (including critical care time) Labs Review Labs Reviewed  BASIC METABOLIC PANEL - Abnormal; Notable for the following:    Potassium 3.4 (*)    Glucose, Bld 104 (*)    Creatinine, Ser 1.24 (*)    GFR calc non Af Amer 48 (*)    GFR calc Af Amer 55 (*)    All other components within normal limits  CBC WITH DIFFERENTIAL/PLATELET - Abnormal; Notable for the following:    Neutrophils Relative % 40 (*)    Lymphocytes Relative 52 (*)    All other components within normal limits  I-STAT TROPOININ, ED  CBG MONITORING, ED    Imaging Review Dg Chest 2 View  01/14/2015   CLINICAL DATA:  Dizziness, blurred vision, chest pain for 2 days, smoker, hypertension  EXAM: CHEST  2 VIEW  COMPARISON:  12/18/2006  FINDINGS: Cardiomediastinal silhouette is unremarkable. No acute infiltrate or pleural effusion. No pulmonary edema. Bony thorax is unremarkable.  IMPRESSION: No active cardiopulmonary disease.   Electronically Signed   By: Natasha MeadLiviu  Pop M.D.   On: 01/14/2015 13:44   Ct Head Wo Contrast  01/14/2015   CLINICAL DATA:  Dizziness and blurred vision  EXAM: CT HEAD WITHOUT CONTRAST  TECHNIQUE: Contiguous axial images were obtained from the base of the skull through the vertex without intravenous contrast.  COMPARISON:  11/13/2009  FINDINGS: Skull and Sinuses:Negative for fracture or destructive process. The mastoids, middle ears, and imaged paranasal sinuses are clear.  Orbits: No acute abnormality.  Brain: No evidence of acute infarction, hemorrhage, hydrocephalus, or mass lesion/mass effect.  IMPRESSION: Negative head CT.   Electronically Signed   By: Marnee SpringJonathon  Watts M.D.   On: 01/14/2015 13:35     EKG Interpretation   Date/Time:  Friday Jan 14 2015 12:27:14 EDT Ventricular Rate:  63 PR Interval:  152 QRS Duration: 78 QT Interval:   448 QTC Calculation: 458 R Axis:   59 Text Interpretation:  Normal sinus rhythm Nonspecific ST abnormality  Abnormal ECG Artifact Confirmed by Manus GunningANCOUR  MD, Brynn Mulgrew ([id number]) on  01/14/2015 1:38:42 PM      MDM   Final diagnoses:  Dizziness   Patient with difficulty with balance, dizziness, blurred vision since yesterday morning. Code stroke not activated due to delay in presentation. Exam is nonfocal other than positive Romberg and ataxic gait.  CT head obtained in triage is negative. Visual acuity as per nursing notes.  Patient states she normally wears reading glasses. Meclizine given.   Given her dizziness and ataxic gait, will obtain MRI to r/o CVA.  MRI pending at time of sign out to Dr. Clarene DukeMcManus.    Glynn OctaveStephen Davonne Baby, MD 01/14/15 [phone]

## 2015-01-17 ENCOUNTER — Emergency Department (HOSPITAL_COMMUNITY)
Admission: EM | Admit: 2015-01-17 | Discharge: 2015-01-17 | Disposition: A | Discharge status: Home / Self Care | Payer: Medicaid Other | Attending: Emergency Medicine | Admitting: Emergency Medicine

## 2015-01-17 ENCOUNTER — Encounter (HOSPITAL_COMMUNITY): Payer: Self-pay | Admitting: *Deleted

## 2015-01-17 DIAGNOSIS — Z720 Tobacco use: Secondary | ICD-10-CM | POA: Diagnosis not present

## 2015-01-17 DIAGNOSIS — Y939 Activity, unspecified: Secondary | ICD-10-CM | POA: Insufficient documentation

## 2015-01-17 DIAGNOSIS — R42 Dizziness and giddiness: Secondary | ICD-10-CM | POA: Insufficient documentation

## 2015-01-17 DIAGNOSIS — I10 Essential (primary) hypertension: Secondary | ICD-10-CM | POA: Insufficient documentation

## 2015-01-17 DIAGNOSIS — S00211A Abrasion of right eyelid and periocular area, initial encounter: Secondary | ICD-10-CM | POA: Diagnosis not present

## 2015-01-17 DIAGNOSIS — Z79899 Other long term (current) drug therapy: Secondary | ICD-10-CM | POA: Insufficient documentation

## 2015-01-17 DIAGNOSIS — W01198A Fall on same level from slipping, tripping and stumbling with subsequent striking against other object, initial encounter: Secondary | ICD-10-CM | POA: Insufficient documentation

## 2015-01-17 DIAGNOSIS — Y929 Unspecified place or not applicable: Secondary | ICD-10-CM | POA: Insufficient documentation

## 2015-01-17 DIAGNOSIS — Z8673 Personal history of transient ischemic attack (TIA), and cerebral infarction without residual deficits: Secondary | ICD-10-CM | POA: Diagnosis not present

## 2015-01-17 DIAGNOSIS — Y999 Unspecified external cause status: Secondary | ICD-10-CM | POA: Diagnosis not present

## 2015-01-17 DIAGNOSIS — S0993XA Unspecified injury of face, initial encounter: Secondary | ICD-10-CM | POA: Diagnosis present

## 2015-01-17 HISTORY — DX: Cerebral infarction, unspecified: I63.9

## 2015-01-17 HISTORY — DX: Essential (primary) hypertension: I10

## 2015-01-17 HISTORY — DX: Anxiety disorder, unspecified: F41.9

## 2015-01-17 MED ORDER — MECLIZINE HCL 25 MG PO TABS
25.0000 mg | ORAL_TABLET | Freq: Once | ORAL | Status: AC
  Administered 2015-01-17: 25 mg via ORAL
  Filled 2015-01-17: qty 1

## 2015-01-17 MED ORDER — MECLIZINE HCL 12.5 MG PO TABS: 12.5000 mg | ORAL_TABLET | Freq: Three times a day (TID) | ORAL | Status: DC | PRN

## 2015-01-17 NOTE — ED Notes (Signed)
Patient ambulated in the room without difficulty.  Discharge instructions and prescription reviewed, voiced understanding.

## 2015-01-17 NOTE — ED Notes (Signed)
EMS reports negative stroke screen, ambulatory with assist.  Here Friday dx with TIA but refused CT scan and left AMA

## 2015-01-17 NOTE — ED Notes (Signed)
Patient requests her Methadone since she was unable to get her dose 115mg 

## 2015-01-17 NOTE — ED Provider Notes (Signed)
CSN: [id number]     Arrival date & time 01/17/15  [id number] History   First MD Initiated Contact with Patient 01/17/15 [phone]     Chief Complaint  Patient presents with  . Fall     (Consider location/radiation/quality/duration/timing/severity/associated sxs/prior Treatment) HPI Comments: 54 year old female presenting back to the emergency department after being evaluated for dizziness 3 days ago, after stating she lost her balance earlier this morning on her way to the methadone clinic causing her to fall and hit the right side of her face on the ground. Denies loss of consciousness. Denies any pain to her face, headache, blurred vision, confusion, lightheadedness or dizziness. States she was dizzy 3 days ago when she presented to the emergency department, was given meclizine with complete relief of her dizziness and unsteadiness. During that visit, it was recommended that she get an MRI to rule out stroke. She had a negative CT. Patient had refused MRI due to anxiety, despite being told she would be given Ativan. She was discharged home with a prescription for meclizine which she reports she has not yet filled. Today, patient is denying any dizziness, just states she feels unsteady. She is requesting her methadone dose since she missed her appointment.  Patient is a 54 y.o. female presenting with fall. The history is provided by the patient and medical records.  Fall    Past Medical History  Diagnosis Date  . Hypertension   . Anxiety   . Stroke    Past Surgical History  Procedure Laterality Date  . Fracture surgery    . Tubal ligation     No family history on file. History  Substance Use Topics  . Smoking status: Current Every Day Smoker  . Smokeless tobacco: Never Used  . Alcohol Use: No   OB History    No data available     Review of Systems  Skin: Positive for wound (abrasion to R side of face).  Neurological:       + Balance difficulty.  All other systems reviewed and are  negative.     Allergies  Flexeril  Home Medications   Prior to Admission medications   Medication Sig Start Date End Date Taking? Authorizing Provider  FLUoxetine (PROZAC) 20 MG capsule Take 20 mg by mouth daily. 11/27/14   Historical Provider, MD  lamoTRIgine (LAMICTAL) 200 MG tablet Take 200 mg by mouth daily. 12/26/14   Historical Provider, MD  lisinopril-hydrochlorothiazide (PRINZIDE,ZESTORETIC) 20-25 MG per tablet Take 1 tablet by mouth daily. 12/26/14   Historical Provider, MD  meclizine (ANTIVERT) 12.5 MG tablet Take 1 tablet (12.5 mg total) by mouth 3 (three) times daily as needed for dizziness. 01/14/15   Glynn OctaveStephen Rancour, MD   BP 131/64 mmHg  Pulse 66  Temp(Src) 98.6 F (37 C) (Oral)  Resp 17  Ht 5\' 6"  (1.676 m)  Wt 195 lb (88.451 kg)  BMI 31.49 kg/m2  SpO2 95% Physical Exam  Constitutional: She is oriented to person, place, and time. She appears well-developed and well-nourished. No distress.  HENT:  Head: Normocephalic.    Mouth/Throat: Oropharynx is clear and moist.  Abrasions to R side of face. No tenderness. No crepitus or step-off. No swelling.  Eyes: Conjunctivae and EOM are normal. Pupils are equal, round, and reactive to light.  EOMI without pain.  Neck: Normal range of motion. Neck supple.  Cardiovascular: Normal rate, regular rhythm, normal heart sounds and intact distal pulses.   Pulmonary/Chest: Effort normal and breath sounds normal. No respiratory distress.  Abdominal: Soft. Bowel sounds are normal. There is no tenderness.  Musculoskeletal: Normal range of motion. She exhibits no edema.  Neurological: She is alert and oriented to person, place, and time. She has normal strength. No cranial nerve deficit or sensory deficit. Coordination and gait normal.  Speech fluent, goal oriented. Moves limbs without ataxia. Ambulates without difficulty. Normal finger to nose bilateral. No pronator drift.  Skin: Skin is warm and dry. No rash noted. She is not  diaphoretic.  Psychiatric: She has a normal mood and affect. Her behavior is normal.  Nursing note and vitals reviewed.   ED Course  Procedures (including critical care time) Labs Review Labs Reviewed - No data to display  Imaging Review No results found.   EKG Interpretation None      MDM   Final diagnoses:  Vertigo   Nontoxic appearing, NAD. AF VSS. Recent visit to the ED as stated above. No focal neurologic deficits on exam. Ambulates without difficulty. Symptoms completely resolve after receiving meclizine. Patient still not wanting MRI, I do not feel this is urgent to have at this time given her unremarkable exam. I advised her to fill the prescription for meclizine and take as directed, and follow-up with her PCP. Stable for discharge.  Addendum- patient did not receive discharge papers 3 days ago, therefore did not receive the prescription for meclizine. Prescription for meclizine given.  Discussed with attending Dr. Littie DeedsGentry who agrees with plan of care.    Kathrynn SpeedRobyn M Talyssa Gibas, PA-C 01/17/15 0719  Kathrynn Speedobyn M Vittorio Mohs, PA-C 01/17/15 [id number]  Mirian MoMatthew Gentry, MD 01/21/15 [phone]

## 2015-01-17 NOTE — Discharge Instructions (Signed)
Take the meclizine prescribed to 3 days ago as directed. Follow-up with your primary care physician.  Vertigo Vertigo means you feel like you or your surroundings are moving when they are not. Vertigo can be dangerous if it occurs when you are at work, driving, or performing difficult activities.  CAUSES  Vertigo occurs when there is a conflict of signals sent to your brain from the visual and sensory systems in your body. There are many different causes of vertigo, including:  Infections, especially in the inner ear.  A bad reaction to a drug or misuse of alcohol and medicines.  Withdrawal from drugs or alcohol.  Rapidly changing positions, such as lying down or rolling over in bed.  A migraine headache.  Decreased blood flow to the brain.  Increased pressure in the brain from a head injury, infection, tumor, or bleeding. SYMPTOMS  You may feel as though the world is spinning around or you are falling to the ground. Because your balance is upset, vertigo can cause nausea and vomiting. You may have involuntary eye movements (nystagmus). DIAGNOSIS  Vertigo is usually diagnosed by physical exam. If the cause of your vertigo is unknown, your caregiver may perform imaging tests, such as an MRI scan (magnetic resonance imaging). TREATMENT  Most cases of vertigo resolve on their own, without treatment. Depending on the cause, your caregiver may prescribe certain medicines. If your vertigo is related to body position issues, your caregiver may recommend movements or procedures to correct the problem. In rare cases, if your vertigo is caused by certain inner ear problems, you may need surgery. HOME CARE INSTRUCTIONS   Follow your caregiver's instructions.  Avoid driving.  Avoid operating heavy machinery.  Avoid performing any tasks that would be dangerous to you or others during a vertigo episode.  Tell your caregiver if you notice that certain medicines seem to be causing your vertigo.  Some of the medicines used to treat vertigo episodes can actually make them worse in some people. SEEK IMMEDIATE MEDICAL CARE IF:   Your medicines do not relieve your vertigo or are making it worse.  You develop problems with talking, walking, weakness, or using your arms, hands, or legs.  You develop severe headaches.  Your nausea or vomiting continues or gets worse.  You develop visual changes.  A family member notices behavioral changes.  Your condition gets worse. MAKE SURE YOU:  Understand these instructions.  Will watch your condition.  Will get help right away if you are not doing well or get worse. Document Released: 06/06/2005 Document Revised: 11/19/2011 Document Reviewed: 03/15/2011 [facility] Patient Information 2015 [place], [place]. This information is not intended to replace advice given to you by your health care provider. Make sure you discuss any questions you have with your health care provider.

## 2015-01-17 NOTE — ED Notes (Signed)
Patient presents via EMS.  Patient was on the way to the Methadone Clinic this morning and became unsteady on her feet and fell on the concrete hitting the right side of her face on the cheek bone and above the eye.  Abrasions noted  Denies LOC

## 2015-01-17 NOTE — ED Notes (Signed)
EMS reports CBG 154, BP 118/70

## 2015-03-29 ENCOUNTER — Ambulatory Visit: Payer: Medicaid Other | Admitting: Neurology

## 2015-03-29 ENCOUNTER — Telehealth: Payer: Self-pay | Admitting: *Deleted

## 2015-03-29 NOTE — Telephone Encounter (Signed)
No showed new patient appointment. 

## 2015-04-18 ENCOUNTER — Ambulatory Visit: Payer: Medicaid Other | Admitting: Neurology

## 2015-04-18 ENCOUNTER — Telehealth: Payer: Self-pay | Admitting: *Deleted

## 2015-04-18 NOTE — Telephone Encounter (Signed)
No showed new patient appt 

## 2015-04-19 ENCOUNTER — Encounter: Payer: Self-pay | Admitting: Neurology

## 2015-11-05 ENCOUNTER — Encounter (HOSPITAL_COMMUNITY): Payer: Self-pay

## 2015-11-05 ENCOUNTER — Emergency Department (HOSPITAL_COMMUNITY): Payer: Medicaid Other

## 2015-11-05 ENCOUNTER — Observation Stay (HOSPITAL_COMMUNITY)
Admission: EM | Admit: 2015-11-05 | Discharge: 2015-11-07 | Disposition: A | Discharge status: Home / Self Care | Payer: Medicaid Other | Attending: Family Medicine | Admitting: Family Medicine

## 2015-11-05 DIAGNOSIS — I10 Essential (primary) hypertension: Secondary | ICD-10-CM | POA: Insufficient documentation

## 2015-11-05 DIAGNOSIS — F419 Anxiety disorder, unspecified: Secondary | ICD-10-CM | POA: Diagnosis not present

## 2015-11-05 DIAGNOSIS — F17210 Nicotine dependence, cigarettes, uncomplicated: Secondary | ICD-10-CM | POA: Diagnosis not present

## 2015-11-05 DIAGNOSIS — G459 Transient cerebral ischemic attack, unspecified: Secondary | ICD-10-CM | POA: Diagnosis not present

## 2015-11-05 DIAGNOSIS — F319 Bipolar disorder, unspecified: Secondary | ICD-10-CM | POA: Insufficient documentation

## 2015-11-05 DIAGNOSIS — H8110 Benign paroxysmal vertigo, unspecified ear: Secondary | ICD-10-CM

## 2015-11-05 DIAGNOSIS — Z79899 Other long term (current) drug therapy: Secondary | ICD-10-CM | POA: Insufficient documentation

## 2015-11-05 DIAGNOSIS — R4781 Slurred speech: Secondary | ICD-10-CM | POA: Diagnosis present

## 2015-11-05 DIAGNOSIS — R42 Dizziness and giddiness: Secondary | ICD-10-CM | POA: Diagnosis not present

## 2015-11-05 DIAGNOSIS — R4182 Altered mental status, unspecified: Secondary | ICD-10-CM | POA: Insufficient documentation

## 2015-11-05 DIAGNOSIS — F1120 Opioid dependence, uncomplicated: Secondary | ICD-10-CM | POA: Diagnosis not present

## 2015-11-05 DIAGNOSIS — Z8673 Personal history of transient ischemic attack (TIA), and cerebral infarction without residual deficits: Secondary | ICD-10-CM | POA: Insufficient documentation

## 2015-11-05 LAB — DIFFERENTIAL
Basophils Absolute: 0 K/uL (ref 0.0–0.1)
Basophils Relative: 1 %
Eosinophils Absolute: 0.2 K/uL (ref 0.0–0.7)
Eosinophils Relative: 3 %
Lymphocytes Relative: 48 %
Lymphs Abs: 2.6 K/uL (ref 0.7–4.0)
Monocytes Absolute: 0.4 K/uL (ref 0.1–1.0)
Monocytes Relative: 7 %
Neutro Abs: 2.2 K/uL (ref 1.7–7.7)
Neutrophils Relative %: 41 %

## 2015-11-05 LAB — I-STAT CHEM 8, ED
BUN: 18 mg/dL (ref 6–20)
Calcium, Ion: 1.19 mmol/L (ref 1.12–1.23)
Chloride: 105 mmol/L (ref 101–111)
Creatinine, Ser: 1.3 mg/dL — ABNORMAL HIGH (ref 0.44–1.00)
Glucose, Bld: 97 mg/dL (ref 65–99)
HCT: 39 % (ref 36.0–46.0)
Hemoglobin: 13.3 g/dL (ref 12.0–15.0)
Potassium: 4.4 mmol/L (ref 3.5–5.1)
Sodium: 141 mmol/L (ref 135–145)
TCO2: 25 mmol/L (ref 0–100)

## 2015-11-05 LAB — RAPID URINE DRUG SCREEN, HOSP PERFORMED
Amphetamines: NOT DETECTED
Barbiturates: NOT DETECTED
Benzodiazepines: NOT DETECTED
Cocaine: NOT DETECTED
Opiates: NOT DETECTED
Tetrahydrocannabinol: NOT DETECTED

## 2015-11-05 LAB — CBC
HCT: 37.6 % (ref 36.0–46.0)
HCT: 37.1 % (ref 36.0–46.0)
HEMOGLOBIN: 11.9 g/dL — AB (ref 12.0–15.0)
Hemoglobin: 12.1 g/dL (ref 12.0–15.0)
MCH: 30.2 pg (ref 26.0–34.0)
MCH: 30.4 pg (ref 26.0–34.0)
MCHC: 32.2 g/dL (ref 30.0–36.0)
MCHC: 32.1 g/dL (ref 30.0–36.0)
MCV: 93.8 fL (ref 78.0–100.0)
MCV: 94.9 fL (ref 78.0–100.0)
Platelets: 228 K/uL (ref 150–400)
Platelets: 222 10*3/uL (ref 150–400)
RBC: 4.01 MIL/uL (ref 3.87–5.11)
RBC: 3.91 MIL/uL (ref 3.87–5.11)
RDW: 13.8 % (ref 11.5–15.5)
RDW: 13.9 % (ref 11.5–15.5)
WBC: 5.4 K/uL (ref 4.0–10.5)
WBC: 5.6 10*3/uL (ref 4.0–10.5)

## 2015-11-05 LAB — CREATININE, SERUM
CREATININE: 1.18 mg/dL — AB (ref 0.44–1.00)
GFR calc Af Amer: 59 mL/min — ABNORMAL LOW (ref >60)
GFR, EST NON AFRICAN AMERICAN: 51 mL/min — AB (ref >60)

## 2015-11-05 LAB — COMPREHENSIVE METABOLIC PANEL WITH GFR
ALT: 29 U/L (ref 14–54)
AST: 27 U/L (ref 15–41)
Albumin: 3.8 g/dL (ref 3.5–5.0)
Alkaline Phosphatase: 59 U/L (ref 38–126)
Anion gap: 11 (ref 5–15)
BUN: 15 mg/dL (ref 6–20)
CO2: 25 mmol/L (ref 22–32)
Calcium: 9.7 mg/dL (ref 8.9–10.3)
Chloride: 106 mmol/L (ref 101–111)
Creatinine, Ser: 1.33 mg/dL — ABNORMAL HIGH (ref 0.44–1.00)
GFR calc Af Amer: 51 mL/min — ABNORMAL LOW
GFR calc non Af Amer: 44 mL/min — ABNORMAL LOW
Glucose, Bld: 94 mg/dL (ref 65–99)
Potassium: 4.6 mmol/L (ref 3.5–5.1)
Sodium: 142 mmol/L (ref 135–145)
Total Bilirubin: 0.4 mg/dL (ref 0.3–1.2)
Total Protein: 6.5 g/dL (ref 6.5–8.1)

## 2015-11-05 LAB — PROTIME-INR
INR: 0.98 (ref 0.00–1.49)
Prothrombin Time: 13.2 s (ref 11.6–15.2)

## 2015-11-05 LAB — URINALYSIS, ROUTINE W REFLEX MICROSCOPIC
Bilirubin Urine: NEGATIVE
Glucose, UA: NEGATIVE mg/dL
Hgb urine dipstick: NEGATIVE
Ketones, ur: NEGATIVE mg/dL
Leukocytes, UA: NEGATIVE
Nitrite: NEGATIVE
Protein, ur: NEGATIVE mg/dL
Specific Gravity, Urine: 1.013 (ref 1.005–1.030)
pH: 6 (ref 5.0–8.0)

## 2015-11-05 LAB — APTT: aPTT: 28 s (ref 24–37)

## 2015-11-05 LAB — I-STAT TROPONIN, ED: Troponin i, poc: 0 ng/mL (ref 0.00–0.08)

## 2015-11-05 LAB — ETHANOL: Alcohol, Ethyl (B): <5 mg/dL

## 2015-11-05 MED ORDER — SENNOSIDES-DOCUSATE SODIUM 8.6-50 MG PO TABS: 1.0000 | ORAL_TABLET | Freq: Every evening | ORAL | Status: DC | PRN

## 2015-11-05 MED ORDER — PANTOPRAZOLE SODIUM 40 MG PO TBEC
40.0000 mg | DELAYED_RELEASE_TABLET | Freq: Every day | ORAL | Status: DC
  Administered 2015-11-06 – 2015-11-07 (×2): 40 mg via ORAL
  Filled 2015-11-05 (×2): qty 1

## 2015-11-05 MED ORDER — ASPIRIN 300 MG RE SUPP: 300.0000 mg | Freq: Every day | RECTAL | Status: DC

## 2015-11-05 MED ORDER — ENOXAPARIN SODIUM 40 MG/0.4ML ~~LOC~~ SOLN
40.0000 mg | SUBCUTANEOUS | Status: DC
  Administered 2015-11-05 – 2015-11-06 (×2): 40 mg via SUBCUTANEOUS
  Filled 2015-11-05 (×2): qty 0.4

## 2015-11-05 MED ORDER — LISINOPRIL-HYDROCHLOROTHIAZIDE 20-25 MG PO TABS: 1.0000 | ORAL_TABLET | Freq: Every day | ORAL | Status: DC

## 2015-11-05 MED ORDER — FLUOXETINE HCL 20 MG PO CAPS
20.0000 mg | ORAL_CAPSULE | Freq: Every day | ORAL | Status: DC
  Administered 2015-11-06 – 2015-11-07 (×2): 20 mg via ORAL
  Filled 2015-11-05 (×2): qty 1

## 2015-11-05 MED ORDER — METHADONE HCL 10 MG PO TABS: 140.0000 mg | ORAL_TABLET | Freq: Every day | ORAL | Status: DC

## 2015-11-05 MED ORDER — MECLIZINE HCL 12.5 MG PO TABS
25.0000 mg | ORAL_TABLET | Freq: Three times a day (TID) | ORAL | Status: DC | PRN
  Administered 2015-11-06: 25 mg via ORAL
  Filled 2015-11-05: qty 2

## 2015-11-05 MED ORDER — STROKE: EARLY STAGES OF RECOVERY BOOK
Freq: Once | Status: AC
  Administered 2015-11-05: 20:00:00
  Filled 2015-11-05: qty 1

## 2015-11-05 MED ORDER — LORAZEPAM 2 MG/ML IJ SOLN
1.0000 mg | Freq: Once | INTRAMUSCULAR | Status: AC
  Administered 2015-11-05: 1 mg via INTRAVENOUS
  Filled 2015-11-05: qty 1

## 2015-11-05 MED ORDER — LISINOPRIL 20 MG PO TABS
20.0000 mg | ORAL_TABLET | Freq: Every day | ORAL | Status: DC
  Administered 2015-11-06 – 2015-11-07 (×2): 20 mg via ORAL
  Filled 2015-11-05 (×2): qty 1

## 2015-11-05 MED ORDER — GABAPENTIN 300 MG PO CAPS
300.0000 mg | ORAL_CAPSULE | Freq: Three times a day (TID) | ORAL | Status: DC
  Administered 2015-11-05: 300 mg via ORAL
  Filled 2015-11-05: qty 1

## 2015-11-05 MED ORDER — LAMOTRIGINE 100 MG PO TABS
200.0000 mg | ORAL_TABLET | Freq: Every day | ORAL | Status: DC
  Administered 2015-11-06 – 2015-11-07 (×2): 200 mg via ORAL
  Filled 2015-11-05 (×3): qty 2

## 2015-11-05 MED ORDER — ASPIRIN 325 MG PO TABS
325.0000 mg | ORAL_TABLET | Freq: Every day | ORAL | Status: DC
  Administered 2015-11-05 – 2015-11-07 (×3): 325 mg via ORAL
  Filled 2015-11-05 (×3): qty 1

## 2015-11-05 MED ORDER — HYDROCHLOROTHIAZIDE 25 MG PO TABS
25.0000 mg | ORAL_TABLET | Freq: Every day | ORAL | Status: DC
  Administered 2015-11-06 – 2015-11-07 (×2): 25 mg via ORAL
  Filled 2015-11-05 (×2): qty 1

## 2015-11-05 NOTE — ED Notes (Addendum)
To room via EMS.  EMS was called to pts home because she was having speech stuttering and unsteady gait.  EMS reports pt was shaking, tearful at her home and was calm during transport.  EMS was called to home yesterday because pt couldn't speak which resolved and pt wasn't transported.  Pt reports has had vertigo yesterday and today and she has taken two doses of medication today.  Pt speech in ED room is normal, no stuttering noted, smile symmetrical, hand grips equal and strong.  A&Ox4.

## 2015-11-05 NOTE — ED Notes (Signed)
Called to MRI for anxiety medication.

## 2015-11-05 NOTE — H&P (Signed)
PATIENT DETAILS Name: Amanda Moody Age: 55 y.o. Sex: female Date of Birth: 1960-11-29 Admit Date: 11/05/2015 ZOX:[id number],JXB A, MD Referring PROVIDER:[place] PA-C   CHIEF COMPLAINT:  Sensory aphasia,Unsteady gait,Vertigo-45 minutes yesterday afternoon.  HPI: Amanda Moody is a 55 y.o. female with a Past Medical History of chronic methadone use, hypertension,? TIA who presents today with the above noted complaint. Per patient, yesterday afternoon she was noted to have unsteady gait, vertigo and difficulty speaking. This lasted for approximately 45 minutes. Denied any focal weakness. Denied any visual symptoms. She was seen by EMS yesterday and refused to come to the emergency room. She subsequently presents today to the emergency room after her family brought her. Denies any chest pain, shortness of breath, fever, nausea, vomiting or diarrhea. There is no history of abdominal pain.   ALLERGIES:   Allergies  Allergen Reactions  . Flexeril [Cyclobenzaprine Hcl] Other (See Comments)    Out of mind    PAST MEDICAL HISTORY: Past Medical History  Diagnosis Date  . Hypertension   . Anxiety   . Stroke [facility])     PAST SURGICAL HISTORY: Past Surgical History  Procedure Laterality Date  . Fracture surgery    . Tubal ligation      MEDICATIONS AT HOME: Prior to Admission medications   Medication Sig Start Date End Date Taking? Authorizing Provider  FLUoxetine (PROZAC) 20 MG capsule Take 20 mg by mouth daily. 11/27/14  Yes Historical Provider, MD  gabapentin (NEURONTIN) 300 MG capsule Take 300 mg by mouth 3 (three) times daily. 10/28/15  Yes Historical Provider, MD  lamoTRIgine (LAMICTAL) 200 MG tablet Take 200 mg by mouth daily. 12/26/14  Yes Historical Provider, MD  lisinopril-hydrochlorothiazide (PRINZIDE,ZESTORETIC) 20-25 MG per tablet Take 1 tablet by mouth daily. 12/26/14  Yes Historical Provider, MD  meclizine (ANTIVERT) 25 MG tablet Take 25 mg by mouth 3 (three) times  daily as needed for dizziness.   Yes Historical Provider, MD  methadone (DOLOPHINE) 10 MG tablet Take 140 mg by mouth daily.   Yes Historical Provider, MD  omeprazole (PRILOSEC) 40 MG capsule Take 40 mg by mouth daily. 10/02/15  Yes Historical Provider, MD    FAMILY HISTORY: Grandmother-CAD  SOCIAL HISTORY:  reports that she has been smoking Cigarettes.  She has been smoking about 0.50 packs per day. She has never used smokeless tobacco. She reports that she does not drink alcohol or use illicit drugs. [facility] at: [facility]  REVIEW OF SYSTEMS:  Constitutional:   No  weight loss, night sweats,  Fevers, chills, fatigue.  HEENT:    No headaches, Dysphagia,Tooth/dental problems,Sore throat,  No sneezing, itching, ear ache, nasal congestion, post nasal drip  Cardio-vascular: No chest pain,Orthopnea, PND,lower extremity edema, anasarca, palpitations  GI:  No heartburn, indigestion, abdominal pain, nausea, vomiting, diarrhea, melena or hematochezia  Resp: No shortness of breath, cough, hemoptysis,plueritic chest pain.   Skin:  No rash or lesions.  GU:  No dysuria, change in color of urine, no urgency or frequency.  No flank pain.  Musculoskeletal: No joint pain or swelling.  No decreased range of motion.  No back pain.  Endocrine: No heat intolerance, no cold intolerance, no polyuria, no polydipsia  Psych: No change in mood or affect. No depression or anxiety.  No memory loss.   PHYSICAL EXAM: Blood pressure 115/74, pulse 70, temperature 99.2 F (37.3 C), temperature source Oral, resp. rate 12, height 5' 6.5" (1.689 m), weight 88.451 kg (195 lb), SpO2 97 %.  General appearance :Awake, alert, not in any distress. Speech Clear. Not toxic Looking HEENT: Atraumatic and Normocephalic, pupils equally reactive to light and accomodation Neck: supple, no JVD. No cervical lymphadenopathy.  Chest:Good air entry bilaterally, no added sounds  CVS: S1 S2 regular, no murmurs.  Abdomen:  Bowel sounds present, Non tender and not distended with no gaurding, rigidity or rebound. Extremities: B/L Lower Ext shows no edema, both legs are warm to touch Neurology:  Non focal Skin:No Rash Wounds:N/A  LABS ON ADMISSION:   Recent Labs  11/05/15 1421 11/05/15 1437  NA 142 141  K 4.6 4.4  CL 106 105  CO2 25  --   GLUCOSE 94 97  BUN 15 18  CREATININE 1.33* 1.30*  CALCIUM 9.7  --     Recent Labs  11/05/15 1421  AST 27  ALT 29  ALKPHOS 59  BILITOT 0.4  PROT 6.5  ALBUMIN 3.8   No results for input(s): LIPASE, AMYLASE in the last 72 hours.  Recent Labs  11/05/15 1421 11/05/15 1437  WBC 5.4  --   NEUTROABS 2.2  --   HGB 12.1 13.3  HCT 37.6 39.0  MCV 93.8  --   PLT 228  --    No results for input(s): CKTOTAL, CKMB, CKMBINDEX, TROPONINI in the last 72 hours. No results for input(s): DDIMER in the last 72 hours. Invalid input(s): POCBNP   RADIOLOGIC STUDIES ON ADMISSION: Mr Sherrin Daisy Contrast  11/05/2015  CLINICAL DATA:  Speech stuttering with unsteady gait. This began earlier today. Vertigo began yesterday. EXAM: MRI HEAD WITHOUT CONTRAST TECHNIQUE: Multiplanar, multiecho pulse sequences of the brain and surrounding structures were obtained without intravenous contrast. COMPARISON:  CT head 01/14/2015. FINDINGS: The patient was unable to remain motionless for the exam. Small or subtle lesions could be overlooked. No evidence for acute infarction, hemorrhage, mass lesion, hydrocephalus, or extra-axial fluid. Normal for age cerebral volume. No significant white matter disease. No visible midline abnormality. Flow voids are maintained. Extracranial soft tissues are unremarkable. IMPRESSION: Motion degraded examination demonstrating no acute or focal intracranial abnormality. Electronically Signed   By: Elsie Stain M.D.   On: 11/05/2015 16:05    I have personally reviewed images of MRI brain   EKG: Personally reviewed. Normal sinus rhythm  ASSESSMENT AND  PLAN: Present on Admission:  . TIA (transient ischemic attack): Suspected TIA, will admit for further workup. Obtain echo and carotid. Check A1c and lipid panel. Await further recommendations from neurology. Continue aspirin.   Marland Kitchen HTN (hypertension): Cautiously continue with lisinopril/HCTZ. Follow.   . Methadone dependence: Continue methadone. EKG shows normal QTC.  Further plan will depend as patient's clinical course evolves and further radiologic and laboratory data become available. Patient will be monitored closely.  Above noted plan was discussed with patient/fiance face to face at bedside, they were in agreement.   CONSULTS: Neuro  DVT Prophylaxis: Prophylactic Lovenox   Code Status: Full Code  Disposition Plan:  Discharge back home  In 1 day,but may warrant SNF depending on clinical course  Total time spent  55 minutes.Greater than 50% of this time was spent in counseling, explanation of diagnosis, planning of further management, and coordination of care.  [facility] Pager [phone]  If 7PM-7AM, please contact night-coverage [email or web address] Password [facility] 11/05/2015, 5:33 PM

## 2015-11-05 NOTE — Consult Note (Signed)
NEURO HOSPITALIST CONSULT NOTE   Requesting physician: Gaylyn Rong, PA-C   Reason for Consult: Transient aphasia.   HPI:                                                                                                                                          Amanda Moody is an 55 y.o. female who presented via EMS for further assessment of stuttering speech and unsteady gait. Shaking and tearful at home. A similar episode occurred yesterday at home for which EMS was called, but symptoms resolved and she was not transported. Endorses vertigo yesterday and today. Her vertigo has been recurrent and she is prescribed PRN meclizine for this. Denies medication overuse or noncompliance. On arrival she was tremulous, but alert and oriented with normal speech and symmetric strength in all extremities. She has a history of hepatitis C, but AST/ALT in ED were normal. She has a PMHx of stroke, HTN and anxiety. Home medications include ASA, Prozac, Neurontin 300 TID "for nerves", HCTZ, Lamictal 200 mg qd for "mood problems", meclizine 25 TID PRN vertigo, lisinopril, and methadone.   Past Medical History  Diagnosis Date  . Hypertension   . Anxiety   . Stroke [facility])     Past Surgical History  Procedure Laterality Date  . Fracture surgery    . Tubal ligation      History reviewed. No pertinent family history.  Social History:  reports that she has been smoking Cigarettes.  She has been smoking about 0.50 packs per day. She has never used smokeless tobacco. She reports that she does not drink alcohol or use illicit drugs.  Allergies  Allergen Reactions  . Flexeril [Cyclobenzaprine Hcl] Other (See Comments)    Out of mind    MEDICATIONS:                                                                                                                     No current facility-administered medications on file prior to encounter.   Current Outpatient Prescriptions on File Prior to  Encounter  Medication Sig Dispense Refill  . FLUoxetine (PROZAC) 20 MG capsule Take 20 mg by mouth daily.  5  . lamoTRIgine (LAMICTAL) 200 MG tablet Take 200 mg by mouth daily.  11  . lisinopril-hydrochlorothiazide (  PRINZIDE,ZESTORETIC) 20-25 MG per tablet Take 1 tablet by mouth daily.  5      ROS:                                                                                                                                       History obtained from patient. Denies fevers or chills.  Some nausea but no vomiting. Endorses continuous tremors for about one year.   Blood pressure 115/74, pulse 70, temperature 99.2 F (37.3 C), temperature source Oral, resp. rate 12, height 5' 6.5" (1.689 m), weight 88.451 kg (195 lb), SpO2 97 %.  Neurologic Examination:                                                                                                      HEENT-  Normocephalic, no lesions, without obvious abnormality.  Normal external eye and conjunctiva.   Extremities: Warm and well-perfused.   Neurological Examination Mental Status: Alert, oriented, thought content appropriate.  Speech fluent. Some difficulty with complex 2-step command, otherwise follows all commands normally. Decreased reaction time. Some difficulty performing multiplication.  Cranial Nerves: II: Visual fields intact to confrontation. PERRL. III,IV, VI: ptosis not present, EOMI with relatively prominent saccadic quality to her smooth pursuits.  V,VII: smile symmetric, facial light touch sensation normal bilaterally VIII: hearing intact to conversation. IX,X: no dysphonia XI: bilateral shoulder shrug is equal XII: midline tongue extension Motor: Right : Upper extremity   5/5    Left:     Upper extremity   5/5  Lower extremity   5/5     Lower extremity   5/5 Normal tone throughout; no atrophy noted Sensory: Temperature and light touch intact x 4.  Deep Tendon Reflexes: Brisk 3+ and symmetric in upper and lower  extremities. Positive Hoffman's sign bilaterally.  Plantars: Right: downgoing   Left: downgoing Cerebellar:  Action tremor bilaterally on FNF. Poor coordination with heel-shin, but not ataxic. Mild asterixis noted bilaterally. Akathisia noted.  Gait: Able to sit up with own power and no instability in seated position. Gait testing deferred.   Lab Results: Basic Metabolic Panel:  Recent Labs Lab 11/05/15 1437  NA 141  K 4.4  CL 105  GLUCOSE 97  BUN 18  CREATININE 1.30*    Liver Function Tests: No results for input(s): AST, ALT, ALKPHOS, BILITOT, PROT, ALBUMIN in the last 168 hours. No results for input(s): LIPASE, AMYLASE in the last 168 hours. No results for input(s): AMMONIA in the last 168  hours.  CBC:  Recent Labs Lab 11/05/15 1437  HGB 13.3  HCT 39.0    Cardiac Enzymes: No results for input(s): CKTOTAL, CKMB, CKMBINDEX, TROPONINI in the last 168 hours.  Lipid Panel: No results for input(s): CHOL, TRIG, HDL, CHOLHDL, VLDL, LDLCALC in the last 168 hours.  CBG: No results for input(s): GLUCAP in the last 168 hours.  Microbiology: Results for orders placed or performed during the hospital encounter of 05/18/08  Rapid strep screen     Status: None   Collection Time: 05/18/08 11:33 AM  Result Value Ref Range Status   Streptococcus, Group A Screen (Direct) NEGATIVE  Final    Coagulation Studies: No results for input(s): LABPROT, INR in the last 72 hours.  Imaging: No results found.  Assessment: 1. Vertigo. No lateralizing cerebellar or brainstem findings on exam. Most likely a peripheral vestibulopathy.  2. Delayed reaction time, asterixis, mild cognitive slowing, tremor and poor coordination without ataxia. Most likely secondary to a toxic or metabolic encephalopathy. Neurontin toxicity can present in this manner. Obtain Neurontin level if possible. Anticholinergic effects of meclizine could also present with cognitive slowing and tremor.  3. Urine toxicology  negative. EtOH level negative. EtOH or benzodiazepine withdrawal should also be considered.  4. Stated history of stroke. No lateralizing findings on exam. MRI obtained in the ED reveals no acute or focal intracranial abnormality.  Recommendations: 1. Ammonia level.  2. Magnesium level.  3. EEG to assess for possible triphasic waves.  4. PT consult.  5. Discontinue meclizine.  6. Continue lamictal and Neurontin for now, as abrupt discontinuation could precipitate seizures.  7. Lamictal and Neurontin levels.  8. CIWA protocol.   Caryl Pina, MD 11/05/2015, 2:53 PM

## 2015-11-05 NOTE — ED Provider Notes (Signed)
CSN: [id number]     Arrival date & time 11/05/15  1323 History   First MD Initiated Contact with Patient 11/05/15 1326     Chief Complaint  Patient presents with  . Neurologic Problem     (Consider location/radiation/quality/duration/timing/severity/associated sxs/prior Treatment) HPI   Amanda Moody is a 55 y.o F with a pmhx of HTN, TIA presents to the emergency department today to be evaluated for slurred speech. Patient states that yesterday patient had a 45 minute episode of slurred speech which is since resolved. Patient states EMS was called yesterday came to her home but patient was not brought to the hospital. Today patient states she is dizzy and was a little off balance but feels that this is due to her vertigo. Patient took meclizine and had relief of her symptoms. However, patient's sister still wanted her to be evaluated for slurring of speech that occurred yesterday. No associated facial drooping or weakness. Denies syncope, chest pain, shortness of breath, neck pain, paresthesias, tremor.  Past Medical History  Diagnosis Date  . Hypertension   . Anxiety   . Stroke [facility])    Past Surgical History  Procedure Laterality Date  . Fracture surgery    . Tubal ligation     History reviewed. No pertinent family history. Social History  Substance Use Topics  . Smoking status: Current Every Day Smoker -- 0.50 packs/day    Types: Cigarettes  . Smokeless tobacco: Never Used  . Alcohol Use: No   OB History    No data available     Review of Systems  All other systems reviewed and are negative.     Allergies  Flexeril  Home Medications   Prior to Admission medications   Medication Sig Start Date End Date Taking? Authorizing Provider  FLUoxetine (PROZAC) 20 MG capsule Take 20 mg by mouth daily. 11/27/14  Yes Historical Provider, MD  gabapentin (NEURONTIN) 300 MG capsule Take 300 mg by mouth 3 (three) times daily. 10/28/15  Yes Historical Provider, MD  lamoTRIgine  (LAMICTAL) 200 MG tablet Take 200 mg by mouth daily. 12/26/14  Yes Historical Provider, MD  lisinopril-hydrochlorothiazide (PRINZIDE,ZESTORETIC) 20-25 MG per tablet Take 1 tablet by mouth daily. 12/26/14  Yes Historical Provider, MD  meclizine (ANTIVERT) 25 MG tablet Take 25 mg by mouth 3 (three) times daily as needed for dizziness.   Yes Historical Provider, MD  methadone (DOLOPHINE) 10 MG tablet Take 140 mg by mouth daily.   Yes Historical Provider, MD  omeprazole (PRILOSEC) 40 MG capsule Take 40 mg by mouth daily. 10/02/15  Yes Historical Provider, MD   BP 115/74 mmHg  Pulse 70  Temp(Src) 99 F (37.2 C) (Oral)  Resp 12  Ht 5' 6.5" (1.689 m)  Wt 88.451 kg  BMI 31.01 kg/m2  SpO2 97% Physical Exam  Constitutional: She is oriented to person, place, and time. She appears well-developed and well-nourished. No distress.  HENT:  Head: Normocephalic and atraumatic.  Mouth/Throat: No oropharyngeal exudate.  Eyes: Conjunctivae and EOM are normal. Pupils are equal, round, and reactive to light. Right eye exhibits no discharge. Left eye exhibits no discharge. No scleral icterus.  Neck: Neck supple. No JVD present.  Cardiovascular: Normal rate, regular rhythm, normal heart sounds and intact distal pulses.  Exam reveals no gallop and no friction rub.   No murmur heard. Pulmonary/Chest: Effort normal and breath sounds normal. No respiratory distress. She has no wheezes. She has no rales. She exhibits no tenderness.  Abdominal: Soft. She exhibits no  distension. There is no tenderness. There is no guarding.  Musculoskeletal: Normal range of motion. She exhibits no edema.  Neurological: She is alert and oriented to person, place, and time. She has normal reflexes. No cranial nerve deficit. She exhibits normal muscle tone. Coordination normal.  No facial droop. No pronator drift. Normal finger to nose. No sensory deficits. Strength 5 out of 5 throughout. No gait abnormality.  Skin: Skin is warm and dry. No  rash noted. She is not diaphoretic. No erythema. No pallor.  Psychiatric: She has a normal mood and affect. Her behavior is normal.  Nursing note and vitals reviewed.   ED Course  Procedures (including critical care time) Labs Review Labs Reviewed  COMPREHENSIVE METABOLIC PANEL - Abnormal; Notable for the following:    Creatinine, Ser 1.33 (*)    GFR calc non Af Amer 44 (*)    GFR calc Af Amer 51 (*)    All other components within normal limits  I-STAT CHEM 8, ED - Abnormal; Notable for the following:    Creatinine, Ser 1.30 (*)    All other components within normal limits  ETHANOL  PROTIME-INR  APTT  CBC  DIFFERENTIAL  URINE RAPID DRUG SCREEN, HOSP PERFORMED  URINALYSIS, ROUTINE W REFLEX MICROSCOPIC (NOT AT [facility])  HEMOGLOBIN A1C  LIPID PANEL  CBC  CREATININE, SERUM  CBC  COMPREHENSIVE METABOLIC PANEL  Rosezena Sensor, ED    Imaging Review Mr Brain Wo Contrast  11/05/2015  CLINICAL DATA:  Speech stuttering with unsteady gait. This began earlier today. Vertigo began yesterday. EXAM: MRI HEAD WITHOUT CONTRAST TECHNIQUE: Multiplanar, multiecho pulse sequences of the brain and surrounding structures were obtained without intravenous contrast. COMPARISON:  CT head 01/14/2015. FINDINGS: The patient was unable to remain motionless for the exam. Small or subtle lesions could be overlooked. No evidence for acute infarction, hemorrhage, mass lesion, hydrocephalus, or extra-axial fluid. Normal for age cerebral volume. No significant white matter disease. No visible midline abnormality. Flow voids are maintained. Extracranial soft tissues are unremarkable. IMPRESSION: Motion degraded examination demonstrating no acute or focal intracranial abnormality. Electronically Signed   By: Elsie Stain M.D.   On: 11/05/2015 16:05   I have personally reviewed and evaluated these images and lab results as part of my medical decision-making.   EKG Interpretation   Date/Time:  Saturday November 05 2015 14:16:17 EST Ventricular Rate:  71 PR Interval:  150 QRS Duration: 74 QT Interval:  410 QTC Calculation: 446 R Axis:   55 Text Interpretation:  Sinus rhythm Abnormal R-wave progression, early  transition no significant change since May 2016 Confirmed by Criss Alvine  MD,  SCOTT ([id number]) on 11/05/2015 3:08:12 PM      MDM   Final diagnoses:  Transient cerebral ischemia, unspecified transient cerebral ischemia type   55 year old female presents to the ED to be evaluated for a transient episode of slurred speech that occurred yesterday. Patient states that she had 45 minutes duration of slurred speech that occurred greater than 24 hours ago. No current neurological deficits. No facial droop or pronator drift. Patient has history of TIA. Concern for additional TIA. Consult to neurology who would like to admit patient for further care and workup as this places her at greater risk for stroke. All lab work within normal limits. MRI brain unremarkable. Spoke with hospitalist who will admit patient to their service    Dub Mikes, PA-C 11/05/15 2000  [name], MD 11/07/15 [id number]

## 2015-11-05 NOTE — ED Notes (Signed)
Attempted report 

## 2015-11-06 ENCOUNTER — Observation Stay (HOSPITAL_COMMUNITY): Payer: Medicaid Other

## 2015-11-06 DIAGNOSIS — F1120 Opioid dependence, uncomplicated: Secondary | ICD-10-CM | POA: Diagnosis not present

## 2015-11-06 DIAGNOSIS — R42 Dizziness and giddiness: Secondary | ICD-10-CM | POA: Diagnosis not present

## 2015-11-06 DIAGNOSIS — G459 Transient cerebral ischemic attack, unspecified: Secondary | ICD-10-CM | POA: Diagnosis not present

## 2015-11-06 DIAGNOSIS — I10 Essential (primary) hypertension: Secondary | ICD-10-CM | POA: Diagnosis not present

## 2015-11-06 LAB — COMPREHENSIVE METABOLIC PANEL
ALT: 26 U/L (ref 14–54)
AST: 29 U/L (ref 15–41)
Albumin: 3.8 g/dL (ref 3.5–5.0)
Alkaline Phosphatase: 48 U/L (ref 38–126)
Anion gap: 10 (ref 5–15)
BUN: 14 mg/dL (ref 6–20)
CHLORIDE: 103 mmol/L (ref 101–111)
CO2: 25 mmol/L (ref 22–32)
CREATININE: 1.29 mg/dL — AB (ref 0.44–1.00)
Calcium: 9.6 mg/dL (ref 8.9–10.3)
GFR, EST AFRICAN AMERICAN: 53 mL/min — AB (ref >60)
GFR, EST NON AFRICAN AMERICAN: 45 mL/min — AB (ref >60)
Glucose, Bld: 115 mg/dL — ABNORMAL HIGH (ref 65–99)
Potassium: 4.1 mmol/L (ref 3.5–5.1)
Sodium: 138 mmol/L (ref 135–145)
TOTAL PROTEIN: 6.5 g/dL (ref 6.5–8.1)
Total Bilirubin: 0.5 mg/dL (ref 0.3–1.2)

## 2015-11-06 LAB — CBC
HCT: 36.2 % (ref 36.0–46.0)
Hemoglobin: 12.2 g/dL (ref 12.0–15.0)
MCH: 31.8 pg (ref 26.0–34.0)
MCHC: 33.7 g/dL (ref 30.0–36.0)
MCV: 94.3 fL (ref 78.0–100.0)
PLATELETS: 207 10*3/uL (ref 150–400)
RBC: 3.84 MIL/uL — ABNORMAL LOW (ref 3.87–5.11)
RDW: 13.9 % (ref 11.5–15.5)
WBC: 5.5 10*3/uL (ref 4.0–10.5)

## 2015-11-06 LAB — LIPID PANEL
CHOLESTEROL: 212 mg/dL — AB (ref 0–200)
HDL: 72 mg/dL (ref >40)
LDL Cholesterol: 121 mg/dL — ABNORMAL HIGH (ref 0–99)
TRIGLYCERIDES: 93 mg/dL (ref <150)
Total CHOL/HDL Ratio: 2.9 RATIO
VLDL: 19 mg/dL (ref 0–40)

## 2015-11-06 LAB — AMMONIA: Ammonia: 32 umol/L (ref 9–35)

## 2015-11-06 LAB — VITAMIN B12: Vitamin B-12: 475 pg/mL (ref 180–914)

## 2015-11-06 LAB — MAGNESIUM: Magnesium: 2.1 mg/dL (ref 1.7–2.4)

## 2015-11-06 MED ORDER — ALPRAZOLAM 0.25 MG PO TABS: 0.2500 mg | ORAL_TABLET | Freq: Three times a day (TID) | ORAL | Status: DC | PRN

## 2015-11-06 MED ORDER — BUSPIRONE HCL 10 MG PO TABS
5.0000 mg | ORAL_TABLET | Freq: Three times a day (TID) | ORAL | Status: DC
  Administered 2015-11-06 – 2015-11-07 (×4): 5 mg via ORAL
  Filled 2015-11-06 (×4): qty 1

## 2015-11-06 MED ORDER — METHADONE HCL 10 MG PO TABS
140.0000 mg | ORAL_TABLET | ORAL | Status: DC
  Administered 2015-11-07: 140 mg via ORAL
  Filled 2015-11-06: qty 14

## 2015-11-06 MED ORDER — METHADONE HCL 10 MG PO TABS
140.0000 mg | ORAL_TABLET | ORAL | Status: AC
  Administered 2015-11-06: 140 mg via ORAL
  Filled 2015-11-06: qty 14

## 2015-11-06 NOTE — Evaluation (Signed)
Occupational Therapy Evaluation Patient Details Name: Amanda Moody MRN: [id number] DOB: 1960-11-20 Today's Date: 11/06/2015    History of Present Illness Amanda Moody is an 55 y.o. female who presented via EMS for further assessment of stuttering speech and unsteady gait. Shaking and tearful at home. A similar episode occurred yesterday at home for which EMS was called, but symptoms resolved and she was not transported. Endorses vertigo yesterday and today. Her vertigo has been recurrent and she is prescribed PRN meclizine for this. Denies medication overuse or noncompliance. On arrival she was tremulous, but alert and oriented with normal speech and symmetric strength in all extremities. She has a history of hepatitis C, but AST/ALT in ED were normal. She has a PMHx of stroke, HTN and anxiety. Home medications include ASA, Prozac, Neurontin 300 TID "for nerves", HCTZ, Lamictal 200 mg qd for "mood problems", meclizine 25 TID PRN vertigo, lisinopril, and methadone.    Clinical Impression   Pt reports she was independent with ADLs PTA. Currently pt is overall min guard-supervision for safety with ADLs and functional mobility. Pt noted to have decreased safety awareness when performing ADL activities and functional mobility. Educated pt on safety and supervision for tub transfers and bathing. Pt planning to d/c home with intermittent supervision from her fiance. Pt would benefit from continued skilled OT to address established goals.    Follow Up Recommendations  No OT follow up;Supervision/Assistance - 24 hour    Equipment Recommendations  Tub/shower seat    Recommendations for Other Services       Precautions / Restrictions Precautions Precautions: Fall Restrictions Weight Bearing Restrictions: No      Mobility Bed Mobility Overal bed mobility: Modified Independent             General bed mobility comments: Pt OOB in chair upon arrival.  Transfers Overall transfer level: Needs  assistance Equipment used: None Transfers: Sit to/from Stand Sit to Stand: Supervision         General transfer comment: Supervision for safety.     Balance Overall balance assessment: History of Falls;Needs assistance Sitting-balance support: Feet supported;No upper extremity supported Sitting balance-Leahy Scale: Good     Standing balance support: No upper extremity supported;During functional activity Standing balance-Leahy Scale: Fair Standing balance comment: Steadier with UE support, but could stand without UE A                            ADL Overall ADL's : Needs assistance/impaired Eating/Feeding: Set up;Sitting   Grooming: Supervision/safety;Standing   Upper Body Bathing: Supervision/ safety;Sitting;Cueing for safety Upper Body Bathing Details (indicate cue type and reason): VCs to sit for UB bathing for safety. Lower Body Bathing: Sit to/from stand;Cueing for safety;Min guard   Upper Body Dressing : Supervision/safety;Sitting   Lower Body Dressing: Min guard;Sit to/from stand   Toilet Transfer: Min guard;Ambulation;RW;Regular Teacher, adult education Details (indicate cue type and reason): Simulated by transfer to chair Toileting- Clothing Manipulation and Hygiene: Min guard;Sit to/from stand       Functional mobility during ADLs: Min guard;Rolling walker General ADL Comments: No family present for OT eval. Educated pt on home safety, need for supervision with bathing and tub transfers.     Vision     Perception     Praxis      Pertinent Vitals/Pain Pain Assessment: No/denies pain     Hand Dominance Right   Extremity/Trunk Assessment Upper Extremity Assessment Upper Extremity Assessment: Overall WFL for tasks  assessed   Lower Extremity Assessment Lower Extremity Assessment: Defer to PT evaluation   Cervical / Trunk Assessment Cervical / Trunk Assessment: Normal   Communication Communication Communication: No difficulties    Cognition Arousal/Alertness: Awake/alert Behavior During Therapy: Impulsive Overall Cognitive Status: No family/caregiver present to determine baseline cognitive functioning Area of Impairment: Safety/judgement         Safety/Judgement: Decreased awareness of deficits         General Comments       Exercises       Shoulder Instructions      Home Living Family/patient expects to be discharged to:: Private residence Living Arrangements: Spouse/significant other Available Help at Discharge: Available PRN/intermittently;Family Type of Home: House Home Access: Stairs to enter Secretary/administrator of Steps: 4-5 Entrance Stairs-Rails: Right Home Layout: One level     Bathroom Shower/Tub: Tub/shower unit Shower/tub characteristics: Engineer, building services: Standard     Home Equipment: None          Prior Functioning/Environment Level of Independence: Independent with assistive device(s)        Comments: sometimes walks with a broom stick without the broom attached    OT Diagnosis: Cognitive deficits   OT Problem List: Impaired balance (sitting and/or standing);Decreased cognition;Decreased safety awareness;Decreased knowledge of use of DME or AE   OT Treatment/Interventions: Self-care/ADL training;Energy conservation;DME and/or AE instruction;Therapeutic activities;Patient/family education;Balance training    OT Goals(Current goals can be found in the care plan section) Acute Rehab OT Goals Patient Stated Goal: none stated OT Goal Formulation: With patient Time For Goal Achievement: 11/20/15 Potential to Achieve Goals: Good ADL Goals Pt Will Perform Grooming: with modified independence;standing Pt Will Perform Upper Body Bathing: with modified independence;sitting Pt Will Perform Lower Body Bathing: with modified independence;sit to/from stand Pt Will Transfer to Toilet: with modified independence;ambulating Pt Will Perform Toileting - Clothing  Manipulation and hygiene: with modified independence;sit to/from stand Pt Will Perform Tub/Shower Transfer: Tub transfer;with supervision;ambulating;shower seat;rolling walker  OT Frequency: Min 2X/week   Barriers to D/C: Decreased caregiver support          Co-evaluation              End of Session Nurse Communication: Mobility status  Activity Tolerance: Patient tolerated treatment well Patient left: in chair;with call bell/phone within reach;with chair alarm set   Time: 1324-4010 OT Time Calculation (min): 24 min Charges:  OT General Charges $OT Visit: 1 Procedure OT Evaluation $OT Eval Moderate Complexity: 1 Procedure OT Treatments $Self Care/Home Management : 8-22 mins G-Codes: OT G-codes **NOT FOR INPATIENT CLASS** Functional Assessment Tool Used: Clinical judgement Functional Limitation: Self care Self Care Current Status (U7253): At least 1 percent but less than 20 percent impaired, limited or restricted Self Care Goal Status (G6440): At least 1 percent but less than 20 percent impaired, limited or restricted   Gaye Alken M.S., OTR/L Pager: [phone]  11/06/2015, 1:29 PM

## 2015-11-06 NOTE — Evaluation (Signed)
Physical Therapy Evaluation Patient Details Name: Amanda Moody MRN: [id number] DOB: 03-07-1961 Today's Date: 11/06/2015   History of Present Illness  Amanda Moody is an 55 y.o. female who presented via EMS for further assessment of stuttering speech and unsteady gait. Shaking and tearful at home. A similar episode occurred yesterday at home for which EMS was called, but symptoms resolved and she was not transported. Endorses vertigo yesterday and today. Her vertigo has been recurrent and she is prescribed PRN meclizine for this. Denies medication overuse or noncompliance. On arrival she was tremulous, but alert and oriented with normal speech and symmetric strength in all extremities. She has a history of hepatitis C, but AST/ALT in ED were normal. She has a PMHx of stroke, HTN and anxiety. Home medications include ASA, Prozac, Neurontin 300 TID "for nerves", HCTZ, Lamictal 200 mg qd for "mood problems", meclizine 25 TID PRN vertigo, lisinopril, and methadone.   Clinical Impression  Pt admitted with above diagnosis. Pt currently with functional limitations due to the deficits listed below (see PT Problem List). Pt will benefit from skilled PT to increase their independence and safety with mobility to allow discharge to the venue listed below. Pt unsteady with gait without AD. Introduced RW and pt steadier.  Pt liked the RW, but is worried about the finances of getting one. Discussed options and that CM would be speak with her.  Pt with vestibular PT order in after initial PT order for mobility. Pt with no c/o dizziness during standard assessment, but will have vestibular trained therapist see next PT session.     Follow Up Recommendations Home health PT;Outpatient PT (depending on insurance coverage and pending vestibular assessment)    Equipment Recommendations  Rolling walker with 5" wheels    Recommendations for Other Services       Precautions / Restrictions Precautions Precautions:  Fall Restrictions Weight Bearing Restrictions: No      Mobility  Bed Mobility Overal bed mobility: Modified Independent             General bed mobility comments: sitting EOB upon arrival  Transfers Overall transfer level: Needs assistance Equipment used: None Transfers: Sit to/from Stand Sit to Stand: Supervision         General transfer comment: S for steadying/balance  Ambulation/Gait Ambulation/Gait assistance: Min guard;Min assist Ambulation Distance (Feet): 100 Feet (x 2) Assistive device: Rolling walker (2 wheeled) Gait Pattern/deviations: Decreased stride length     General Gait Details: Pt with decreased step length with lateral sway while walking without AD with decreased heel strike and foot placement on lateral side of foot. When given RW, she demonstrated more of a normalized gait pattern. Pt reports she felt steadier.  Stairs            Wheelchair Mobility    Modified Rankin (Stroke Patients Only) Modified Rankin (Stroke Patients Only) Pre-Morbid Rankin Score: No significant disability Modified Rankin: Moderate disability     Balance Overall balance assessment: History of Falls;Needs assistance           Standing balance-Leahy Scale: Fair Standing balance comment: Steadier with UE support, but could stand without UE A                             Pertinent Vitals/Pain Pain Assessment: No/denies pain    Home Living Family/patient expects to be discharged to:: Private residence Living Arrangements: Spouse/significant other Available Help at Discharge: Available PRN/intermittently;Family (fiancee works 6-4 M-F  and until 2 on Sat.) Type of Home: House Home Access: Stairs to enter Entrance Stairs-Rails: Right Entrance Stairs-Number of Steps: 4-5 Home Layout: One level Home Equipment: Other (comment) (broom stick)      Prior Function Level of Independence: Independent with assistive device(s)         Comments:  sometimes walks with a broom stick without the broom attached     Hand Dominance        Extremity/Trunk Assessment   Upper Extremity Assessment: Defer to OT evaluation           Lower Extremity Assessment: Overall WFL for tasks assessed      Cervical / Trunk Assessment: Normal  Communication   Communication: No difficulties  Cognition Arousal/Alertness: Awake/alert Behavior During Therapy: Impulsive Overall Cognitive Status: No family/caregiver present to determine baseline cognitive functioning Area of Impairment: Safety/judgement         Safety/Judgement: Decreased awareness of deficits          General Comments General comments (skin integrity, edema, etc.): Pt focused on getting bathed.    Exercises        Assessment/Plan    PT Assessment Patient needs continued PT services  PT Diagnosis Difficulty walking   PT Problem List Decreased strength;Decreased activity tolerance;Decreased balance;Decreased mobility;Decreased coordination;Decreased knowledge of use of DME  PT Treatment Interventions DME instruction;Gait training;Stair training;Functional mobility training;Therapeutic activities;Therapeutic exercise;Balance training   PT Goals (Current goals can be found in the Care Plan section) Acute Rehab PT Goals Patient Stated Goal: To get a walker PT Goal Formulation: With patient Time For Goal Achievement: 11/20/15 Potential to Achieve Goals: Good    Frequency Min 4X/week   Barriers to discharge Decreased caregiver support      Co-evaluation               End of Session Equipment Utilized During Treatment: Gait belt Activity Tolerance: Patient tolerated treatment well Patient left: in chair;with call bell/phone within reach;with chair alarm set Nurse Communication: Mobility status    Functional Assessment Tool Used: clinical judgement and objective findings Functional Limitation: Mobility: Walking and moving around Mobility: Walking and  Moving Around Current Status [phone]): At least 1 percent but less than 20 percent impaired, limited or restricted Mobility: Walking and Moving Around Goal Status [phone]): At least 1 percent but less than 20 percent impaired, limited or restricted Mobility: Walking and Moving Around Discharge Status [phone]): At least 1 percent but less than 20 percent impaired, limited or restricted    Time: 9147-8295 PT Time Calculation (min) (ACUTE ONLY): 18 min   Charges:   PT Evaluation $PT Eval Moderate Complexity: 1 Procedure     PT G Codes:   PT G-Codes **NOT FOR INPATIENT CLASS** Functional Assessment Tool Used: clinical judgement and objective findings Functional Limitation: Mobility: Walking and moving around Mobility: Walking and Moving Around Current Status (A2130): At least 1 percent but less than 20 percent impaired, limited or restricted Mobility: Walking and Moving Around Goal Status [phone]): At least 1 percent but less than 20 percent impaired, limited or restricted Mobility: Walking and Moving Around Discharge Status [phone]): At least 1 percent but less than 20 percent impaired, limited or restricted    Mackson Botz LUBECK 11/06/2015, 11:02 AM

## 2015-11-06 NOTE — Progress Notes (Signed)
[facility] PROGRESS NOTE  Amanda Moody ZOX:[id number] DOB: 03/29/1961 DOA: 11/05/2015 PCP: Vivien Presto, MD  Assessment/Plan: 1. TIA- patient presented with suspected TIA, workup for TIAs underway. Echo and carotid are pending. MRI brain negative for stroke. 2. Vertigo/dizziness- patient has had intermittent vertigo for past 6 months. No clear etiology at this time. We'll obtain vestibular PT. Will discontinue Neurontin at this time as it can contribute to patient's dizziness. 3. Anxiety- patient has been taking Neurontin for anxiety, which could have been controverted to dizziness. Will discontinue Neurontin at this time and start BuSpar 5 mg by mouth 3 times a day for anxiety. 4. Bipolar disorder- continue Lamictal, Prozac 5. Methadone dependence- continue methadone. 6. DVT prophylaxis- Lovenox  Code Status: Full code Family Communication: No family at bedside Disposition Plan: Pending workup for TIA   Consultants:  Neurology  Procedures:  None  Antibiotics:  None  HPI/Subjective: 55 y.o. female with a Past Medical History of chronic methadone use, hypertension,? TIA who presents today with the above noted complaint. Per patient, yesterday afternoon she was noted to have unsteady gait, vertigo and difficulty speaking. This lasted for approximately 45 minutes. Denied any focal weakness. Denied any visual symptoms. She was seen by EMS yesterday and refused to come to the emergency room. She subsequently presents today to the emergency room after her family brought her.  This morning patient denies any dizziness. But says that she has been dizzy for past 6 months. She has been taking Neurontin, Lamictal, methadone. She takes Neurontin for anxiety.  Objective: Filed Vitals:   11/06/15 0800 11/06/15 1022  BP: 110/70 107/70  Pulse: 62 64  Temp: 97.9 F (36.6 C) 98 F (36.7 C)  Resp: 17 18    Intake/Output Summary (Last 24 hours) at 11/06/15 1230 Last data filed at  11/05/15 2100  Gross per 24 hour  Intake    240 ml  Output      0 ml  Net    240 ml   Filed Weights   11/05/15 1337  Weight: 88.451 kg (195 lb)    Exam:   General:  Appears in no acute distress  Cardiovascular: S1-S2 regular  Respiratory: Clear to auscultation bilaterally  Abdomen: Soft, nontender, no organomegaly  Musculoskeletal: No cyanosis/clubbing/edema of the lower extremities   Data Reviewed: Basic Metabolic Panel:  Recent Labs Lab 11/05/15 1421 11/05/15 1437 11/05/15 2010 11/06/15 0528 11/06/15 0857  NA 142 141  --  138  --   K 4.6 4.4  --  4.1  --   CL 106 105  --  103  --   CO2 25  --   --  25  --   GLUCOSE 94 97  --  115*  --   BUN 15 18  --  14  --   CREATININE 1.33* 1.30* 1.18* 1.29*  --   CALCIUM 9.7  --   --  9.6  --   MG  --   --   --   --  2.1   Liver Function Tests:  Recent Labs Lab 11/05/15 1421 11/06/15 0528  AST 27 29  ALT 29 26  ALKPHOS 59 48  BILITOT 0.4 0.5  PROT 6.5 6.5  ALBUMIN 3.8 3.8     Recent Labs Lab 11/06/15 0857  AMMONIA 32   CBC:  Recent Labs Lab 11/05/15 1421 11/05/15 1437 11/05/15 2010 11/06/15 0528  WBC 5.4  --  5.6 5.5  NEUTROABS 2.2  --   --   --  HGB 12.1 13.3 11.9* 12.2  HCT 37.6 39.0 37.1 36.2  MCV 93.8  --  94.9 94.3  PLT 228  --  222 207     Studies: Mr Brain Wo Contrast  11/05/2015  CLINICAL DATA:  Speech stuttering with unsteady gait. This began earlier today. Vertigo began yesterday. EXAM: MRI HEAD WITHOUT CONTRAST TECHNIQUE: Multiplanar, multiecho pulse sequences of the brain and surrounding structures were obtained without intravenous contrast. COMPARISON:  CT head 01/14/2015. FINDINGS: The patient was unable to remain motionless for the exam. Small or subtle lesions could be overlooked. No evidence for acute infarction, hemorrhage, mass lesion, hydrocephalus, or extra-axial fluid. Normal for age cerebral volume. No significant white matter disease. No visible midline abnormality. Flow  voids are maintained. Extracranial soft tissues are unremarkable. IMPRESSION: Motion degraded examination demonstrating no acute or focal intracranial abnormality. Electronically Signed   By: Elsie Stain M.D.   On: 11/05/2015 16:05    Scheduled Meds: . aspirin  300 mg Rectal Daily   Or  . aspirin  325 mg Oral Daily  . busPIRone  5 mg Oral TID  . enoxaparin (LOVENOX) injection  40 mg Subcutaneous Q24H  . FLUoxetine  20 mg Oral Daily  . lisinopril  20 mg Oral Daily   And  . hydrochlorothiazide  25 mg Oral Daily  . lamoTRIgine  200 mg Oral Daily  . [START ON 11/07/2015] methadone  140 mg Oral Q24H  . pantoprazole  40 mg Oral Daily   Continuous Infusions:   Principal Problem:   TIA (transient ischemic attack) Active Problems:   HTN (hypertension)   Vertigo   Methadone dependence (HCC)    Time spent: 25 min    [facility] S  [facility] Pager [phone]. If 7PM-7AM, please contact night-coverage at [email or web address], password [facility] 11/06/2015, 12:30 PM

## 2015-11-06 NOTE — Progress Notes (Signed)
Echocardiogram 2D Echocardiogram has been performed.  Amanda Moody 11/06/2015, 2:24 PM

## 2015-11-06 NOTE — Progress Notes (Signed)
Confirmed methadone dose  daily with [facility] in [place].  Clinic RN reports that pt rec'd doses to take home for the weekend and should return unused doses to clinic.  Clinic made notes of pt's arrival time to [facility].  Vernard Gambles, PharmD, BCPS 11/06/2015 6:33 AM

## 2015-11-06 NOTE — Progress Notes (Signed)
Received from ER via stretcher; patient walked into the room from the hallway; gait slightly unsteady; oriented patient to room and unit routine; fall safety reviewed and bed alarm set with bed in low position.

## 2015-11-07 ENCOUNTER — Ambulatory Visit (HOSPITAL_COMMUNITY): Payer: Medicaid Other

## 2015-11-07 ENCOUNTER — Observation Stay (HOSPITAL_BASED_OUTPATIENT_CLINIC_OR_DEPARTMENT_OTHER): Payer: Medicaid Other

## 2015-11-07 DIAGNOSIS — R4182 Altered mental status, unspecified: Secondary | ICD-10-CM | POA: Diagnosis not present

## 2015-11-07 DIAGNOSIS — F1120 Opioid dependence, uncomplicated: Secondary | ICD-10-CM | POA: Diagnosis not present

## 2015-11-07 DIAGNOSIS — Z79899 Other long term (current) drug therapy: Secondary | ICD-10-CM

## 2015-11-07 DIAGNOSIS — H8110 Benign paroxysmal vertigo, unspecified ear: Secondary | ICD-10-CM

## 2015-11-07 DIAGNOSIS — G459 Transient cerebral ischemic attack, unspecified: Secondary | ICD-10-CM | POA: Diagnosis not present

## 2015-11-07 DIAGNOSIS — R42 Dizziness and giddiness: Secondary | ICD-10-CM | POA: Diagnosis not present

## 2015-11-07 DIAGNOSIS — I10 Essential (primary) hypertension: Secondary | ICD-10-CM | POA: Diagnosis not present

## 2015-11-07 LAB — HEMOGLOBIN A1C
Hgb A1c MFr Bld: 5.9 % — ABNORMAL HIGH (ref 4.8–5.6)
Mean Plasma Glucose: 123 mg/dL

## 2015-11-07 MED ORDER — LISINOPRIL-HYDROCHLOROTHIAZIDE 10-12.5 MG PO TABS: 1.0000 | ORAL_TABLET | Freq: Every day | ORAL | Status: DC

## 2015-11-07 MED ORDER — BUSPIRONE HCL 5 MG PO TABS: 5.0000 mg | ORAL_TABLET | Freq: Three times a day (TID) | ORAL | Status: DC

## 2015-11-07 MED ORDER — GABAPENTIN 100 MG PO CAPS
200.0000 mg | ORAL_CAPSULE | Freq: Three times a day (TID) | ORAL | Status: DC
  Administered 2015-11-07: 200 mg via ORAL
  Filled 2015-11-07: qty 2

## 2015-11-07 NOTE — Progress Notes (Signed)
VASCULAR LAB PRELIMINARY  PRELIMINARY  PRELIMINARY  PRELIMINARY  Carotid duplex completed.    Bilateral:  1-39% ICA stenosis.  Vertebral artery flow is antegrade.     Jenetta Loges, RVT, RDMS 11/07/2015, 12:59 PM

## 2015-11-07 NOTE — Progress Notes (Signed)
Patient ready for discharge to home; walker delivered; discharge instructions given and reviewed; Rx's sent electronically; patient ambulating out of hospital with walker; all belongings returned; accompanied by family member.

## 2015-11-07 NOTE — Discharge Summary (Signed)
Physician Discharge Summary  Amanda Moody ZOX:[id number] DOB: 09/09/1961 DOA: 11/05/2015  PCP: Amanda Presto, MD  Admit date: 11/05/2015 Discharge date: 11/07/2015  Time spent: 25* minutes  Recommendations for Outpatient Follow-up:  Follow up PCP in 2 weeks   Discharge Diagnoses:  Principal Problem:   TIA (transient ischemic attack) Active Problems:   HTN (hypertension)   Vertigo   Methadone dependence (HCC)   Discharge Condition: Stable  Diet recommendation: Low salt diet  Filed Weights   11/05/15 1337  Weight: 88.451 kg (195 lb)    History of present illness:  55 y.o. female with a Past Medical History of chronic methadone use, hypertension,? TIA who presents today with the above noted complaint. Per patient, yesterday afternoon she was noted to have unsteady gait, vertigo and difficulty speaking. This lasted for approximately 45 minutes. Denied any focal weakness. Denied any visual symptoms. She was seen by EMS yesterday and refused to come to the emergency room. She subsequently presents today to the emergency room after her family brought   Hospital Course:  1. TIA- patient presented with suspected TIA, workup for TIAs underway. Echo and carotid are negative for acute abnormality. MRI brain negative for stroke. 2. Vertigo/dizziness- patient has had intermittent vertigo for past 6 months.We obtained vestibular PT and patient found to have left BPPV. Will discontinue Neurontin at this time as it can contribute to patient's dizziness. Take meclizine prn.Was seen by Neurology EEG was canceled. Neurontin discontinued. 3. Anxiety- patient has been taking Neurontin for anxiety, which could have been controverted to dizziness. Will discontinue Neurontin at this time and start BuSpar 5 mg by mouth 3 times a day for anxiety. 4. Bipolar disorder- continue Lamictal, Prozac 5. Methadone dependence- continue methadone 6. Hypertension- BP in low 100's, will change the Zestoretic to 12.5/10  mg daily  Procedures:  Echo  Carotid duplex  Consultations:  Neurology  Discharge Exam: Filed Vitals:   11/07/15 1050 11/07/15 1355  BP: 113/75 100/56  Pulse: 61 62  Temp: 98 F (36.7 C) 98.7 F (37.1 C)  Resp: 20 20    General: Appears in no acute distress Cardiovascular: S1s2 normal, RRR Respiratory: Clear bilaterally  Discharge Instructions   Discharge Instructions    Diet - low sodium heart healthy    Complete by:  As directed      Increase activity slowly    Complete by:  As directed           Current Discharge Medication List    START taking these medications   Details  busPIRone (BUSPAR) 5 MG tablet Take 1 tablet (5 mg total) by mouth 3 (three) times daily. Qty: 90 tablet, Refills: 2    lisinopril-hydrochlorothiazide (ZESTORETIC) 10-12.5 MG tablet Take 1 tablet by mouth daily. Qty: 30 tablet, Refills: 2      CONTINUE these medications which have NOT CHANGED   Details  FLUoxetine (PROZAC) 20 MG capsule Take 20 mg by mouth daily. Refills: 5    lamoTRIgine (LAMICTAL) 200 MG tablet Take 200 mg by mouth daily. Refills: 11    meclizine (ANTIVERT) 25 MG tablet Take 25 mg by mouth 3 (three) times daily as needed for dizziness.    methadone (DOLOPHINE) 10 MG tablet Take 140 mg by mouth daily.    omeprazole (PRILOSEC) 40 MG capsule Take 40 mg by mouth daily. Refills: 11      STOP taking these medications     gabapentin (NEURONTIN) 300 MG capsule      lisinopril-hydrochlorothiazide (PRINZIDE,ZESTORETIC) 20-25 MG  per tablet        Allergies  Allergen Reactions  . Flexeril [Cyclobenzaprine Hcl] Other (See Comments)    Out of mind      The results of significant diagnostics from this hospitalization (including imaging, microbiology, ancillary and laboratory) are listed below for reference.    Significant Diagnostic Studies: Mr Sherrin Daisy Contrast  11/05/2015  CLINICAL DATA:  Speech stuttering with unsteady gait. This began earlier today.  Vertigo began yesterday. EXAM: MRI HEAD WITHOUT CONTRAST TECHNIQUE: Multiplanar, multiecho pulse sequences of the brain and surrounding structures were obtained without intravenous contrast. COMPARISON:  CT head 01/14/2015. FINDINGS: The patient was unable to remain motionless for the exam. Small or subtle lesions could be overlooked. No evidence for acute infarction, hemorrhage, mass lesion, hydrocephalus, or extra-axial fluid. Normal for age cerebral volume. No significant white matter disease. No visible midline abnormality. Flow voids are maintained. Extracranial soft tissues are unremarkable. IMPRESSION: Motion degraded examination demonstrating no acute or focal intracranial abnormality. Electronically Signed   By: Elsie Stain M.D.   On: 11/05/2015 16:05    Microbiology: No results found for this or any previous visit (from the past 240 hour(s)).   Labs: Basic Metabolic Panel:  Recent Labs Lab 11/05/15 1421 11/05/15 1437 11/05/15 2010 11/06/15 0528 11/06/15 0857  NA 142 141  --  138  --   K 4.6 4.4  --  4.1  --   CL 106 105  --  103  --   CO2 25  --   --  25  --   GLUCOSE 94 97  --  115*  --   BUN 15 18  --  14  --   CREATININE 1.33* 1.30* 1.18* 1.29*  --   CALCIUM 9.7  --   --  9.6  --   MG  --   --   --   --  2.1   Liver Function Tests:  Recent Labs Lab 11/05/15 1421 11/06/15 0528  AST 27 29  ALT 29 26  ALKPHOS 59 48  BILITOT 0.4 0.5  PROT 6.5 6.5  ALBUMIN 3.8 3.8   No results for input(s): LIPASE, AMYLASE in the last 168 hours.  Recent Labs Lab 11/06/15 0857  AMMONIA 32   CBC:  Recent Labs Lab 11/05/15 1421 11/05/15 1437 11/05/15 2010 11/06/15 0528  WBC 5.4  --  5.6 5.5  NEUTROABS 2.2  --   --   --   HGB 12.1 13.3 11.9* 12.2  HCT 37.6 39.0 37.1 36.2  MCV 93.8  --  94.9 94.3  PLT 228  --  222 207    CBG: No results for input(s): GLUCAP in the last 168 hours.     SignedMeredeth Ide MD.  [facility] 11/07/2015, 4:32 PM

## 2015-11-07 NOTE — Care Management Note (Signed)
Case Management Note  Patient Details  Name: Lilliah Priego MRN: [id number] Date of Birth: [birth year]  Subjective/Objective:                    Action/Plan: Patient discharging home with self care. Orders for rolling walker. Jermaine with [facility] notified and will deliver the equipment to the room. Bedside RN updated.   Expected Discharge Date:                  Expected Discharge Plan:     In-House Referral:     Discharge planning Services     Post Acute Care Choice:    Choice offered to:     DME Arranged:    DME Agency:     HH Arranged:    HH Agency:     Status of Service:  In process, will continue to follow  Medicare Important Message Given:    Date Medicare IM Given:    Medicare IM give by:    Date Additional Medicare IM Given:    Additional Medicare Important Message give by:     If discussed at Long Length of Stay Meetings, dates discussed:    Additional Comments:  Kermit Balo, RN 11/07/2015, 5:15 PM

## 2015-11-07 NOTE — Care Management Note (Signed)
Case Management Note  Patient Details  Name: Amanda Moody MRN: [id number] Date of Birth: 1961-01-07  Subjective/Objective:  Patient admitted with TIA. MRI results negative. Patient is from home with her spouse.                   Action/Plan: PT recommending HHPT. CM following for discharge disposition.   Expected Discharge Date:                  Expected Discharge Plan:     In-House Referral:     Discharge planning Services     Post Acute Care Choice:    Choice offered to:     DME Arranged:    DME Agency:     HH Arranged:    HH Agency:     Status of Service:  In process, will continue to follow  Medicare Important Message Given:    Date Medicare IM Given:    Medicare IM give by:    Date Additional Medicare IM Given:    Additional Medicare Important Message give by:     If discussed at Long Length of Stay Meetings, dates discussed:    Additional Comments:  Kermit Balo, RN 11/07/2015, 2:46 PM

## 2015-11-07 NOTE — Progress Notes (Addendum)
Physical Therapy Vestibular Evaluation  See below for specific details of testing. Found patient to have Lt horizontal canal BPPV. Used Appiani maneuver for cannalith repositioning. 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 Educated patient on vestibular exercises to improve the function and to NOT take meclizine unless she is having a severe episode of vertigo. (The vestibular exercises will not be effective if taking meclizine).  Handouts provided re: all education provided. Questions were answered. Patient continues to need the rolling walker for home and Marylene Land, RN is following up on this with Case Management.   11/07/15 1545  Vestibular Assessment  General Observation Reports 2 falls due to strong sense of imbalance (being pulled to her left)  Symptom Behavior  Type of Dizziness Imbalance  Frequency of Dizziness daily  Duration of Dizziness <1 minute if she becomes still; will start again if she repeats the movement  Aggravating Factors Sit to stand;Forward bending  Relieving Factors Head stationary;Slow movements  Occulomotor Exam  Occulomotor Alignment Normal  Spontaneous Absent  Vestibulo-Occular Reflex  VOR 1 Head Only (x 1 viewing) maintained target 10 seconds on repeated trials  VOR to Slow Head Movement Normal  VOR Cancellation Normal  Auditory  Comments reports last month has heard "swooshing" noise bil ears  Positional Testing  Dix-Hallpike Dix-Hallpike Right;Dix-Hallpike Left  Horizontal Canal Testing Horizontal Canal Right;Horizontal Canal Left;Horizontal Canal Right Intensity;Horizontal Canal Left Intensity  Dix-Hallpike Right  Dix-Hallpike Right Duration 0  Dix-Hallpike Right Symptoms No nystagmus  Dix-Hallpike Left  Dix-Hallpike Left Duration 0  Dix-Hallpike Left Symptoms No nystagmus  Horizontal Canal Right  Horizontal Canal Right Duration 1 second  Horizontal Canal Right  Symptoms Ageotrophic  Horizontal Canal Left  Horizontal Canal Left Duration <1 second  Horizontal Canal Left Symptoms Ageotrophic  Horizontal Canal Right Intensity  Horizontal Canal Right Intensity (little to none)  Horizontal Canal Left Intensity  Horizontal Canal Left Intensity Mild    PT Plan Discharge plan needs to be updated     11/07/15 1559  PT - Assessment/Plan  PT Frequency (ACUTE ONLY) Min 4X/week  Follow Up Recommendations Outpatient PT;Supervision for mobility/OOB (vestibular rehab)  PT equipment Rolling walker with 5" wheels           End of Session   Activity Tolerance: Patient tolerated treatment well Patient left: in chair;with call bell/phone within reach;with chair alarm set     Time: 1610-9604 PT Time Calculation (min) (ACUTE ONLY): 67 min  Charges:  $Therapeutic Exercise: 8-22 mins $Therapeutic Activity: 8-22 mins $Canalith Rep Proc: 8-22 mins     11/07/2015 Veda Canning, PT Pager: [phone]

## 2015-11-07 NOTE — Progress Notes (Signed)
Subjective: In bed very labile crying stating she wants to go home.   Exam: Filed Vitals:   11/07/15 0546 11/07/15 1050  BP: 110/85 113/75  Pulse: 63 61  Temp: 98.3 F (36.8 C) 98 F (36.7 C)  Resp: 20 20       Gen: In bed, NAD MS: alert and oriented.  CN: grossly intact Motor:  MAEW Sensory: intact   Pertinent Labs: none  Felicie Morn PA-C [facility] [phone]   Seen with Dr. Lavon Paganini. Please see his attestation note for A/P for any additional work up recommendations.    Impression:    Recommendations:     11/07/2015, 12:16 PM

## 2015-11-07 NOTE — Progress Notes (Signed)
SLP Cancellation Note  Patient Details Name: Amanda Moody MRN: [id number] DOB: 07-09-1961   Cancelled treatment:       Reason Eval/Treat Not Completed: SLP screened, no needs identified, will sign off   Jemaine Prokop, Riley Nearing 11/07/2015, 7:56 AM

## 2015-11-08 LAB — LAMOTRIGINE LEVEL: Lamotrigine Lvl: 22.5 ug/mL — ABNORMAL HIGH (ref 2.0–20.0)

## 2015-11-08 LAB — VITAMIN B1: VITAMIN B1 (THIAMINE): 129.1 nmol/L (ref 66.5–200.0)

## 2015-11-09 LAB — MISC LABCORP TEST (SEND OUT): LABCORP TEST CODE: 716811

## 2015-11-24 ENCOUNTER — Encounter: Payer: Self-pay | Admitting: Neurology

## 2015-11-24 ENCOUNTER — Ambulatory Visit: Payer: Medicaid Other | Admitting: Neurology

## 2015-11-24 ENCOUNTER — Telehealth: Payer: Self-pay | Admitting: *Deleted

## 2015-11-24 NOTE — Telephone Encounter (Signed)
No showed new patient appointment. 

## 2015-11-28 NOTE — Progress Notes (Signed)
Multiple attempts to call Mrs. Amanda Moody about her recent Lamictal lab which showed her level to be supra-therapeutic. Numbers called including sister were : sister [phone], mobile [phone] and her home [phone] (not working) and not able to get incontact with her.  Will attempt again tomorrow.   [facility] [phone] Dr. Dalia Headingorrington's office was called [facility] and explained [name] Lamictal level was supra-therapeutic and she will need to contact patient and decrease her daily dose to 150 mg daily from previous 200 mg daily. Also recommended a follow up with Dr. Salvadore Farberorrington as she was a no show for her neurology consult.    Felicie MornDavid Smith PA-C [facility] [phone]  11/28/2015, 4:16 PM

## 2018-08-14 ENCOUNTER — Emergency Department (HOSPITAL_COMMUNITY)
Admission: EM | Admit: 2018-08-14 | Discharge: 2018-08-14 | Disposition: A | Discharge status: Home / Self Care | Payer: Medicaid Other | Attending: Emergency Medicine | Admitting: Emergency Medicine

## 2018-08-14 ENCOUNTER — Encounter (HOSPITAL_COMMUNITY): Payer: Self-pay

## 2018-08-14 DIAGNOSIS — Z79899 Other long term (current) drug therapy: Secondary | ICD-10-CM | POA: Diagnosis not present

## 2018-08-14 DIAGNOSIS — F17210 Nicotine dependence, cigarettes, uncomplicated: Secondary | ICD-10-CM | POA: Insufficient documentation

## 2018-08-14 DIAGNOSIS — I10 Essential (primary) hypertension: Secondary | ICD-10-CM | POA: Diagnosis not present

## 2018-08-14 DIAGNOSIS — N3090 Cystitis, unspecified without hematuria: Secondary | ICD-10-CM | POA: Diagnosis not present

## 2018-08-14 DIAGNOSIS — R300 Dysuria: Secondary | ICD-10-CM | POA: Diagnosis present

## 2018-08-14 LAB — URINALYSIS, ROUTINE W REFLEX MICROSCOPIC
Bilirubin Urine: NEGATIVE
GLUCOSE, UA: NEGATIVE mg/dL
Ketones, ur: NEGATIVE mg/dL
NITRITE: NEGATIVE
Protein, ur: NEGATIVE mg/dL
SPECIFIC GRAVITY, URINE: 1.008 (ref 1.005–1.030)
WBC, UA: >50 WBC/hpf — ABNORMAL HIGH (ref 0–5)
pH: 6 (ref 5.0–8.0)

## 2018-08-14 MED ORDER — CEPHALEXIN 500 MG PO CAPS: 500.0000 mg | ORAL_CAPSULE | Freq: Two times a day (BID) | ORAL | 0 refills | Status: DC

## 2018-08-14 NOTE — ED Provider Notes (Signed)
[facility] Provider Note   CSN: [id number] Arrival date & time: 08/14/18  [id number]    History   Chief Complaint No chief complaint on file. dysuria   HPI Amanda Moody is a 57 y.o. female.  HPI   57 year old female presents today with complaints of dysuria.  Patient notes 4-day history of burning with urination.  She notes suprapubic pain and pressure.  Patient notes increased urinary frequency.  She denies any vaginal discharge or bleeding, notes she originally had blood in her urine at the onset of symptoms.  She denies any abdominal pain, nausea vomiting or fever.  No medications prior to arrival today.  No recent urinary tract infections.  Patient is not sexually active.  Past Medical History:  Diagnosis Date  . Anxiety   . Hypertension   . Stroke [facility](HCC)     Patient Active Problem List   Diagnosis Date Noted  . BPPV (benign paroxysmal positional vertigo) 11/07/2015  . Polypharmacy   . Altered mental status   . TIA (transient ischemic attack) 11/05/2015  . HTN (hypertension) 11/05/2015  . Vertigo 11/05/2015  . Methadone dependence (HCC) 11/05/2015    Past Surgical History:  Procedure Laterality Date  . FRACTURE SURGERY    . TUBAL LIGATION       OB History   None      Home Medications    Prior to Admission medications   Medication Sig Start Date End Date Taking? Authorizing Provider  busPIRone (BUSPAR) 5 MG tablet Take 1 tablet (5 mg total) by mouth 3 (three) times daily. 11/07/15   Meredeth IdeLama, Gagan S, MD  cephALEXin (KEFLEX) 500 MG capsule Take 1 capsule (500 mg total) by mouth 2 (two) times daily. 08/14/18   Landree Fernholz, Tinnie GensJeffrey, PA-C  FLUoxetine (PROZAC) 20 MG capsule Take 20 mg by mouth daily. 11/27/14   [provider]  lamoTRIgine (LAMICTAL) 200 MG tablet Take 200 mg by mouth daily. 12/26/14   [provider]  lisinopril-hydrochlorothiazide (ZESTORETIC) 10-12.5 MG tablet Take 1 tablet by mouth daily. 11/07/15   Meredeth IdeLama,  Gagan S, MD  meclizine (ANTIVERT) 25 MG tablet Take 25 mg by mouth 3 (three) times daily as needed for dizziness.    [provider]  methadone (DOLOPHINE) 10 MG tablet Take 140 mg by mouth daily.    [provider]  omeprazole (PRILOSEC) 40 MG capsule Take 40 mg by mouth daily. 10/02/15   [provider]    Family History No family history on file.  Social History Social History   Tobacco Use  . Smoking status: Current Every Day Smoker    Packs/day: 0.50    Types: Cigarettes  . Smokeless tobacco: Never Used  Substance Use Topics  . Alcohol use: No  . Drug use: No     Allergies   Flexeril [cyclobenzaprine hcl]   Review of Systems Review of Systems  All other systems reviewed and are negative.   Physical Exam Updated Vital Signs BP (!) 143/96   Pulse 67   Temp 97.8 F (36.6 C) (Oral)   Resp 18   SpO2 99%   Physical Exam  Constitutional: She is oriented to person, place, and time. She appears well-developed and well-nourished.  HENT:  Head: Normocephalic and atraumatic.  Eyes: Pupils are equal, round, and reactive to light. Conjunctivae are normal. Right eye exhibits no discharge. Left eye exhibits no discharge. No scleral icterus.  Neck: Normal range of motion. No JVD present. No tracheal deviation present.  Pulmonary/Chest: Effort normal. No stridor.  Abdominal: Soft. Bowel sounds are normal. She exhibits no distension and no mass. There is no tenderness. There is no rebound and no guarding. No hernia.  No CVA tenderness  Neurological: She is alert and oriented to person, place, and time. Coordination normal.  Psychiatric: She has a normal mood and affect. Her behavior is normal. Judgment and thought content normal.  Nursing note and vitals reviewed.    ED Treatments / Results  Labs (all labs ordered are listed, but only abnormal results are displayed) Labs Reviewed  URINALYSIS, ROUTINE W REFLEX MICROSCOPIC - Abnormal; Notable for  the following components:      Result Value   APPearance HAZY (*)    Hgb urine dipstick SMALL (*)    Leukocytes, UA LARGE (*)    WBC, UA >50 (*)    Bacteria, UA MANY (*)    All other components within normal limits    EKG None  Radiology No results found.  Procedures Procedures (including critical care time)  Medications Ordered in ED Medications - No data to display   Initial Impression / Assessment and Plan / ED Course  I have reviewed the triage vital signs and the nursing notes.  Pertinent labs & imaging results that were available during my care of the patient were reviewed by me and considered in my medical decision making (see chart for details).     Labs: urinalysis  Imaging:  Consults:  Therapeutics:  Discharge Meds: Keflex  Assessment/Plan: 57 year old female presents today with uncomplicated cystitis.  Patient discharged with Keflex return precautions given.  She verbalized understanding and agreement to today's plan.      Final Clinical Impressions(s) / ED Diagnoses   Final diagnoses:  Cystitis    ED Discharge Orders         Ordered    cephALEXin (KEFLEX) 500 MG capsule  2 times daily     08/14/18 0800           Eyvonne Mechanic, PA-C 08/14/18 2130    [name], MD 08/15/18 [phone]

## 2018-08-14 NOTE — Discharge Instructions (Addendum)
Please read attached information. If you experience any new or worsening signs or symptoms please return to the emergency room for evaluation. Please follow-up with your primary care provider or specialist as discussed. Please use medication prescribed only as directed and discontinue taking if you have any concerning signs or symptoms.   °

## 2018-08-14 NOTE — ED Notes (Signed)
Signature pad not working. Patient verbalized understanding discharge instructions.

## 2018-08-14 NOTE — ED Triage Notes (Signed)
Patient complains of dysuria and bladder pressure x 4 days. Denies fever, no chills. NAD

## 2020-01-06 ENCOUNTER — Ambulatory Visit: Payer: Medicaid Other | Attending: Family Medicine | Admitting: Family Medicine

## 2020-01-06 ENCOUNTER — Other Ambulatory Visit: Payer: Self-pay

## 2020-03-09 ENCOUNTER — Ambulatory Visit: Payer: Medicaid Other | Attending: Nurse Practitioner | Admitting: Nurse Practitioner

## 2020-03-09 ENCOUNTER — Other Ambulatory Visit: Payer: Self-pay

## 2021-07-31 LAB — COLOGUARD: COLOGUARD: POSITIVE — AB

## 2021-08-18 ENCOUNTER — Telehealth: Payer: Self-pay | Admitting: Internal Medicine

## 2021-08-18 NOTE — Telephone Encounter (Signed)
ID Phone Note:  I called patients significant other Agricultural engineer) and was asked to speak to Leane Para regarding hepatitis C treatment.  She has never been treated and when I saw Myster August Saucer, I had instructed his significant other to call our office for an appointment to further evaluate her.  She did so and was informed she needed a referral.  It appears that a referral was placed in August 2022 for hepatitis C, however, this was sent to GI who does not treat HCV.  They attempted to call patient but her phone number is not in the chart correctly.  Her correct number, per her, is [phone].  I went ahead and scheduled her as a new patient for September 19, 2021 at 10:30am for hepatitis C evaluation and treatment with myself.  Will route note to our staff as patient indicated she may need transportation help and to assist with updating her phone number.  She also stated that if she cannot be reached it would be okay to contact her through [employer].    Vedia Coffer for Infectious Disease [facility] 08/18/2021, 3:19 PM

## 2021-09-19 ENCOUNTER — Encounter: Payer: Self-pay | Admitting: Internal Medicine

## 2021-09-19 ENCOUNTER — Other Ambulatory Visit: Payer: Self-pay

## 2021-09-19 ENCOUNTER — Ambulatory Visit (INDEPENDENT_AMBULATORY_CARE_PROVIDER_SITE_OTHER): Payer: Medicaid Other | Admitting: Internal Medicine

## 2021-09-19 VITALS — BP 130/75 | HR 76 | Temp 98.0°F | Ht 67.0 in | Wt 198.4 lb

## 2021-09-19 DIAGNOSIS — I10 Essential (primary) hypertension: Secondary | ICD-10-CM | POA: Diagnosis not present

## 2021-09-19 DIAGNOSIS — B182 Chronic viral hepatitis C: Secondary | ICD-10-CM | POA: Insufficient documentation

## 2021-09-19 DIAGNOSIS — F1120 Opioid dependence, uncomplicated: Secondary | ICD-10-CM

## 2021-09-19 DIAGNOSIS — A539 Syphilis, unspecified: Secondary | ICD-10-CM | POA: Diagnosis not present

## 2021-09-19 NOTE — Assessment & Plan Note (Signed)
She reports being stable on methadone.  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

## 2021-09-19 NOTE — Assessment & Plan Note (Signed)
BP today is well controlled.  Follow up with her PCP.

## 2021-09-19 NOTE — Progress Notes (Signed)
[place] for Infectious Disease  Reason for Consult: Hepatitis C  Referring Provider: Dr Neta Mends  Chief Complaint  Patient presents with   SEXUALLY TRANSMITTED DISEASE   Establish Care    HPI:    Amanda Moody is a 60 y.o. female with PMHx as below who presents to the clinic for hepatitis C.   Patient presents as a new patient for evaluation of hepatitis C.  She reports initially being tested and told she was positive about 15 years ago.  She has never had treatment for HCV.  Her prior risk factors for HCV are remote history of IVDU and a prior partner with HCV.  She is currently not engaged in these activities.  She has no prior history of liver disease but states she was told once about fatty liver.  She has no family history of the same.    She also wants to get tested for syphilis since her partner recently tested positive for late latent syphilis.  Patient's Medications  New Prescriptions   No medications on file  Previous Medications   DIAZEPAM (VALIUM) 5 MG TABLET    Take 5 mg by mouth 2 (two) times daily as needed.   FLUOXETINE (PROZAC) 20 MG CAPSULE    Take 20 mg by mouth daily.   LISINOPRIL-HYDROCHLOROTHIAZIDE (ZESTORETIC) 10-12.5 MG TABLET    Take 1 tablet by mouth daily.   METHADONE (DOLOPHINE) 10 MG TABLET    Take 140 mg by mouth daily.   OMEPRAZOLE (PRILOSEC) 40 MG CAPSULE    Take 40 mg by mouth daily.  Modified Medications   No medications on file  Discontinued Medications   BUSPIRONE (BUSPAR) 5 MG TABLET    Take 1 tablet (5 mg total) by mouth 3 (three) times daily.   CEPHALEXIN (KEFLEX) 500 MG CAPSULE    Take 1 capsule (500 mg total) by mouth 2 (two) times daily.   LAMOTRIGINE (LAMICTAL) 200 MG TABLET    Take 200 mg by mouth daily.   MECLIZINE (ANTIVERT) 25 MG TABLET    Take 25 mg by mouth 3 (three) times daily as needed for dizziness.      Past Medical History:  Diagnosis Date   Anxiety    Hypertension    Stroke [facility])     Social  History   Tobacco Use   Smoking status: Every Day    Packs/day: 0.50    Types: Cigarettes   Smokeless tobacco: Never  Vaping Use   Vaping Use: Never used  Substance Use Topics   Alcohol use: No    Comment: social drinker one drink   Drug use: No    History reviewed. No pertinent family history.  Allergies  Allergen Reactions   Flexeril [Cyclobenzaprine Hcl] Other (See Comments)    Out of mind    Review of Systems  Constitutional:  Positive for malaise/fatigue. Negative for fever.  Respiratory: Negative.    Cardiovascular: Negative.   Gastrointestinal: Negative.   All other systems reviewed and are negative.    OBJECTIVE:    Vitals:   09/19/21 0946  BP: 130/75  Pulse: 76  Temp: 98 F (36.7 C)  SpO2: 99%  Weight: 198 lb 6.4 oz (90 kg)  Height: 5\' 7"  (1.702 m)     Body mass index is 31.07 kg/m.  Physical Exam Constitutional:      General: She is not in acute distress.    Appearance: Normal appearance.  HENT:     Head: Normocephalic and  atraumatic.  Eyes:     General: No scleral icterus. Cardiovascular:     Rate and Rhythm: Normal rate and regular rhythm.  Pulmonary:     Effort: Pulmonary effort is normal. No respiratory distress.     Breath sounds: Normal breath sounds.  Abdominal:     General: There is no distension.     Palpations: Abdomen is soft.     Tenderness: There is no abdominal tenderness. There is no guarding or rebound.  Musculoskeletal:        General: Normal range of motion.  Skin:    General: Skin is warm and dry.     Coloration: Skin is not jaundiced.  Neurological:     General: No focal deficit present.     Mental Status: She is alert and oriented to person, place, and time.     Labs and Microbiology:  CBC Latest Ref Rng & Units 11/06/2015 11/05/2015 11/05/2015  WBC 4.0 - 10.5 K/uL 5.5 5.6 -  Hemoglobin 12.0 - 15.0 g/dL 12.2 11.9(L) 13.3  Hematocrit 36.0 - 46.0 % 36.2 37.1 39.0  Platelets 150 - 400 K/uL 207 222 -   CMP  Latest Ref Rng & Units 11/06/2015 11/05/2015 11/05/2015  Glucose 65 - 99 mg/dL 115(H) - 97  BUN 6 - 20 mg/dL 14 - 18  Creatinine 0.44 - 1.00 mg/dL 1.29(H) 1.18(H) 1.30(H)  Sodium 135 - 145 mmol/L 138 - 141  Potassium 3.5 - 5.1 mmol/L 4.1 - 4.4  Chloride 101 - 111 mmol/L 103 - 105  CO2 22 - 32 mmol/L 25 - -  Calcium 8.9 - 10.3 mg/dL 9.6 - -  Total Protein 6.5 - 8.1 g/dL 6.5 - -  Total Bilirubin 0.3 - 1.2 mg/dL 0.5 - -  Alkaline Phos 38 - 126 U/L 48 - -  AST 15 - 41 U/L 29 - -  ALT 14 - 54 U/L 26 - -      ASSESSMENT & PLAN:    Syphilis Her partner recently tested positive for late latent syphilis and was treated.  She has no concerns for primary, secondary, or early infection at this time but would like to be tested.  Will check RPR and treat if positive for late latent syphilis.   Chronic hepatitis C without hepatic coma ([place]) New Patient with Chronic Hepatitis C genotype unkown, untreated.   I discussed with the patient the lab findings that are needed to confirm chronic hepatitis C.  I discussed the pathogenesis, transmission, prevention, risks of left untreated, and treatment options for hepatitis C. I  discussed the importance and benefits of treatment.  PLAN: -- Patient counseled on limiting acetaminophen, avoidance of alcohol. -- Transmission discussed with patient including sexual transmission, injection drug use, sharing razors and toothbrush.   -- Will need referral to gastroenterology if concern for cirrhosis -- Will prescribe appropriate medication based on genotype and coverage  -- Hepatitis A and B titers with vaccination as needed -- Pneumococcal vaccine if not previously given and evidence of cirrhosis -- Further work up to include liver staging with Fibrosis scoring and calculated FIB-4 score.  Elastography if concern for advanced fibrosis and/or cirrhosis -- Will follow up in 2 weeks to discuss labs as below -- Labs needed within the last 6 months:   CBC CMP Hep A  and B serologies  HIV HCV genotype HCV Ab and RNA Measure of fibrosis   Hypertension BP today is well controlled.  Follow up with her PCP.   Methadone dependence ([place])  She reports being stable on methadone.  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   Orders Placed This Encounter  Procedures   Hepatitis B core antibody, total   Hepatitis B surface antibody,qualitative   Hepatitis B surface antigen   Hepatitis C RNA quantitative   Liver Fibrosis, FibroTest-ActiTest   Hepatitis C antibody   Hepatitis C genotype   CBC   HIV Antibody (routine testing w rflx)   COMPLETE METABOLIC PANEL WITH GFR   Hepatitis A antibody, total   RPR      Raynelle Highland for Infectious Disease [facility] 09/19/2021, 10:24 AM

## 2021-09-19 NOTE — Patient Instructions (Signed)
Thank you for coming to see me today. It was a pleasure seeing you.  To Do: Labs today Based on results will determine appropriate treatment for hepatitis C and syphilis. Follow up in 2 weeks  If you have any questions or concerns, please do not hesitate to call the office at [phone].  Take Care,   Gwynn Burly

## 2021-09-19 NOTE — Assessment & Plan Note (Signed)
New Patient with Chronic Hepatitis C genotype unkown, untreated.   I discussed with the patient the lab findings that are needed to confirm chronic hepatitis C.  I discussed the pathogenesis, transmission, prevention, risks of left untreated, and treatment options for hepatitis C. I  discussed the importance and benefits of treatment.  PLAN: -- Patient counseled on limiting acetaminophen, avoidance of alcohol. -- Transmission discussed with patient including sexual transmission, injection drug use, sharing razors and toothbrush.   -- Will need referral to gastroenterology if concern for cirrhosis -- Will prescribe appropriate medication based on genotype and coverage  -- Hepatitis A and B titers with vaccination as needed -- Pneumococcal vaccine if not previously given and evidence of cirrhosis -- Further work up to include liver staging with Fibrosis scoring and calculated FIB-4 score.  Elastography if concern for advanced fibrosis and/or cirrhosis -- Will follow up in 2 weeks to discuss labs as below -- Labs needed within the last 6 months:   CBC CMP Hep A and B serologies  HIV HCV genotype HCV Ab and RNA Measure of fibrosis

## 2021-09-19 NOTE — Assessment & Plan Note (Signed)
Her partner recently tested positive for late latent syphilis and was treated.  She has no concerns for primary, secondary, or early infection at this time but would like to be tested.  Will check RPR and treat if positive for late latent syphilis.

## 2021-09-20 ENCOUNTER — Telehealth: Payer: Self-pay

## 2021-09-20 NOTE — Telephone Encounter (Signed)
-----   Message from Kathlynn Grate, DO sent at 09/20/2021  2:27 PM EST ----- Can you please let patient know that I am still waiting on the results of her hepatitis C work up, however, her syphilis testing was negative so she does not need treatment for this.  I will see her again in 2 weeks to discuss further hep C treatment options.   Thanks

## 2021-09-20 NOTE — Telephone Encounter (Signed)
Patient does not have working phone number on file. Results to be discussed at upcoming appointment.   Sandie Ano, RN

## 2021-09-20 NOTE — Telephone Encounter (Signed)
Patient returned call, RN relayed that her syphilis testing was negative and that we are still waiting on hepatitis C labs. Reminded her of follow up in 2 weeks. Patient very excited by this news.   Sandie Ano, RN

## 2021-09-25 LAB — COMPLETE METABOLIC PANEL WITH GFR
AG Ratio: 1.2 (calc) (ref 1.0–2.5)
ALT: 35 U/L — ABNORMAL HIGH (ref 6–29)
AST: 45 U/L — ABNORMAL HIGH (ref 10–35)
Albumin: 4.1 g/dL (ref 3.6–5.1)
Alkaline phosphatase (APISO): 83 U/L (ref 37–153)
BUN/Creatinine Ratio: 15 (calc) (ref 6–22)
BUN: 21 mg/dL (ref 7–25)
CO2: 27 mmol/L (ref 20–32)
Calcium: 9.9 mg/dL (ref 8.6–10.4)
Chloride: 101 mmol/L (ref 98–110)
Creat: 1.41 mg/dL — ABNORMAL HIGH (ref 0.50–1.05)
Globulin: 3.5 g/dL (calc) (ref 1.9–3.7)
Glucose, Bld: 76 mg/dL (ref 65–99)
Potassium: 3.9 mmol/L (ref 3.5–5.3)
Sodium: 136 mmol/L (ref 135–146)
Total Bilirubin: 0.3 mg/dL (ref 0.2–1.2)
Total Protein: 7.6 g/dL (ref 6.1–8.1)
eGFR: 42 mL/min/{1.73_m2} — ABNORMAL LOW (ref >=60)

## 2021-09-25 LAB — LIVER FIBROSIS, FIBROTEST-ACTITEST
ALT: 33 U/L — ABNORMAL HIGH (ref 6–29)
Alpha-2-Macroglobulin: 346 mg/dL — ABNORMAL HIGH (ref 106–279)
Apolipoprotein A1: 292 mg/dL — ABNORMAL HIGH (ref 101–198)
Bilirubin: 0.3 mg/dL (ref 0.2–1.2)
GGT: 287 U/L — ABNORMAL HIGH (ref 3–65)
Haptoglobin: 174 mg/dL (ref 43–212)

## 2021-09-25 LAB — HEPATITIS A ANTIBODY, TOTAL: Hepatitis A AB,Total: REACTIVE — AB

## 2021-09-25 LAB — CBC
HCT: 36 % (ref 35.0–45.0)
Hemoglobin: 12 g/dL (ref 11.7–15.5)
MCH: 31.5 pg (ref 27.0–33.0)
MCHC: 33.3 g/dL (ref 32.0–36.0)
MCV: 94.5 fL (ref 80.0–100.0)
MPV: 10.4 fL (ref 7.5–12.5)
Platelets: 245 10*3/uL (ref 140–400)
RBC: 3.81 10*6/uL (ref 3.80–5.10)
RDW: 11.8 % (ref 11.0–15.0)
WBC: 7.7 10*3/uL (ref 3.8–10.8)

## 2021-09-25 LAB — HEPATITIS C RNA QUANTITATIVE
HCV Quantitative Log: 7.37 log IU/mL — ABNORMAL HIGH
HCV RNA, PCR, QN: 23500000 IU/mL — ABNORMAL HIGH

## 2021-09-25 LAB — HEPATITIS C ANTIBODY
Hepatitis C Ab: REACTIVE — AB
SIGNAL TO CUT-OFF: >11 — ABNORMAL HIGH (ref <1.00)

## 2021-09-25 LAB — HEPATITIS B SURFACE ANTIGEN: Hepatitis B Surface Ag: NONREACTIVE

## 2021-09-25 LAB — HIV ANTIBODY (ROUTINE TESTING W REFLEX): HIV 1&2 Ab, 4th Generation: NONREACTIVE

## 2021-09-25 LAB — HEPATITIS C GENOTYPE

## 2021-09-25 LAB — HEPATITIS B SURFACE ANTIBODY,QUALITATIVE: Hep B S Ab: REACTIVE — AB

## 2021-09-25 LAB — HEPATITIS B CORE ANTIBODY, TOTAL: Hep B Core Total Ab: NONREACTIVE

## 2021-09-25 LAB — RPR: RPR Ser Ql: NONREACTIVE

## 2021-09-26 ENCOUNTER — Telehealth: Payer: Self-pay

## 2021-09-26 ENCOUNTER — Other Ambulatory Visit (HOSPITAL_COMMUNITY): Payer: Self-pay

## 2021-09-26 NOTE — Telephone Encounter (Signed)
[facility] Patient Advocate Encounter   Received notification from [facility] [employer] that prior authorization for Mavyret is required.   PA submitted on 09/26/2021 Key [id number] W Status is pending    [facility] will continue to follow.   Clearance Coots, CPhT Specialty Pharmacy Patient [facility] for Infectious Disease Phone: [phone] Fax:  [phone]

## 2021-09-28 ENCOUNTER — Other Ambulatory Visit (HOSPITAL_COMMUNITY): Payer: Self-pay

## 2021-09-28 ENCOUNTER — Other Ambulatory Visit: Payer: Self-pay | Admitting: Pharmacist

## 2021-09-28 ENCOUNTER — Telehealth: Payer: Self-pay

## 2021-09-28 DIAGNOSIS — B182 Chronic viral hepatitis C: Secondary | ICD-10-CM

## 2021-09-28 MED ORDER — MAVYRET 100-40 MG PO TABS
3.0000 | ORAL_TABLET | Freq: Every day | ORAL | 1 refills | Status: DC
  Filled 2021-09-28: qty 84, fill #0
  Filled 2021-09-29: qty 84, 28d supply, fill #0
  Filled 2021-10-24: qty 84, 28d supply, fill #1

## 2021-09-28 NOTE — Telephone Encounter (Signed)
Mavyret x 8 weeks script sent to [facility]. Patient will be counseled during next appointment on 1/24. Thanks Lupita Leash!

## 2021-09-28 NOTE — Telephone Encounter (Signed)
[facility] Patient Advocate Encounter  Prior Authorization for Mavyret has been approved.    PA# [id number] Effective dates: 09/26/21 through 11/21/21  Patients co-pay is $4.00.   Prescription can be sent to [facility].  [facility] will continue to follow.  Ileene Patrick, [place] [facility] Patient [facility] for Infectious Disease Phone: [phone] Fax:  [phone]

## 2021-09-29 ENCOUNTER — Other Ambulatory Visit (HOSPITAL_COMMUNITY): Payer: Self-pay

## 2021-10-02 ENCOUNTER — Telehealth: Payer: Self-pay

## 2021-10-02 NOTE — Telephone Encounter (Signed)
[facility] Patient Advocate Encounter  Patient's medications have been couriered to [facility] from [facility] pharmacy and will be picked up 10/03/21.  Clearance Coots , CPhT Specialty Pharmacy Patient [facility] for Infectious Disease Phone: [phone] Fax:  [phone]

## 2021-10-03 ENCOUNTER — Other Ambulatory Visit: Payer: Self-pay

## 2021-10-03 ENCOUNTER — Encounter: Payer: Self-pay | Admitting: Internal Medicine

## 2021-10-03 ENCOUNTER — Ambulatory Visit (INDEPENDENT_AMBULATORY_CARE_PROVIDER_SITE_OTHER): Payer: Medicaid Other

## 2021-10-03 ENCOUNTER — Ambulatory Visit (INDEPENDENT_AMBULATORY_CARE_PROVIDER_SITE_OTHER): Payer: Medicaid Other | Admitting: Internal Medicine

## 2021-10-03 VITALS — BP 114/79 | HR 64 | Temp 96.7°F | Wt 198.0 lb

## 2021-10-03 DIAGNOSIS — B182 Chronic viral hepatitis C: Secondary | ICD-10-CM

## 2021-10-03 DIAGNOSIS — Z23 Encounter for immunization: Secondary | ICD-10-CM

## 2021-10-03 DIAGNOSIS — A539 Syphilis, unspecified: Secondary | ICD-10-CM

## 2021-10-03 DIAGNOSIS — Z7185 Encounter for immunization safety counseling: Secondary | ICD-10-CM

## 2021-10-03 NOTE — Progress Notes (Signed)
° °  Covid-19 Vaccination Clinic  Name:  Amanda Moody    MRN: [id number] DOB: 1961/04/01  10/03/2021  Amanda Moody was observed post Covid-19 immunization for 15 minutes without incident. She was provided with Vaccine Information Sheet and instruction to access the V-Safe system.   Amanda Moody was instructed to call 911 with any severe reactions post vaccine: Difficulty breathing  Swelling of face and throat  A fast heartbeat  A bad rash all over body  Dizziness and weakness   Immunizations Administered     Name Date Dose VIS Date Route   Pfizer Covid-19 Vaccine Bivalent Booster 10/03/2021  4:11 PM 0.3 mL 05/10/2021 Intramuscular   Manufacturer: [employer], [employer]   Lot: [id number]   NDC: [phone]      Amanda Moody [name]

## 2021-10-03 NOTE — Addendum Note (Signed)
Addended by: Tressa Busman T on: 10/03/2021 09:11 AM   Modules accepted: Orders

## 2021-10-03 NOTE — Assessment & Plan Note (Addendum)
Will start Mayvret daily x 8 weeks and medication given to patient today.  She was counseled on administration and side effects and need for strict adherence.  Repeat Fibrsosure today and obtain liver US with elastography.  RTC 4 weeks where she will be given her next box of medications.

## 2021-10-03 NOTE — Assessment & Plan Note (Signed)
Her syphilis testing was negative and she does not require further treatment.

## 2021-10-03 NOTE — Assessment & Plan Note (Signed)
Will give COVID Booster and PCV 20 today.

## 2021-10-03 NOTE — Progress Notes (Signed)
[place] for Infectious Disease  CHIEF COMPLAINT:    Follow up for hepatitis C  SUBJECTIVE:    Amanda Moody is a 61 y.o. female with PMHx as below who presents to the clinic for hepatitis C.   Patient presents today for HCV follow up.  Pre-treatment work up includes:  HCV Ab reactive Genotype 1b  HCV RNA 23,500,000 copies  Hep A and B immune  FIB-4 1.92  Her Fibrosure was cancelled by lab due to an error so will need to be repeated.  Plan is for Mavyret x 8 weeks  Please see A&P for the details of today's visit and status of the patient's medical problems.   Patient's Medications  New Prescriptions   No medications on file  Previous Medications   CLONAZEPAM (KLONOPIN) 1 MG TABLET    Take 1 mg by mouth 2 (two) times daily as needed.   DIAZEPAM (VALIUM) 5 MG TABLET    Take 5 mg by mouth 2 (two) times daily as needed.   FLUOXETINE (PROZAC) 40 MG CAPSULE    Take 40 mg by mouth daily.   GABAPENTIN (NEURONTIN) 300 MG CAPSULE    Take 300 mg by mouth 3 (three) times daily.   GLECAPREVIR-PIBRENTASVIR (MAVYRET) 100-40 MG TABS    Take 3 tablets by mouth daily with breakfast.   LISINOPRIL-HYDROCHLOROTHIAZIDE (ZESTORETIC) 10-12.5 MG TABLET    Take 1 tablet by mouth daily.   METHADONE (DOLOPHINE) 10 MG TABLET    Take 140 mg by mouth daily.   OMEPRAZOLE (PRILOSEC) 40 MG CAPSULE    Take 40 mg by mouth daily.   PANTOPRAZOLE (PROTONIX) 40 MG TABLET    Take 40 mg by mouth daily.  Modified Medications   No medications on file  Discontinued Medications   DIAZEPAM (VALIUM) 5 MG TABLET    Take one tablet (5 mg dose) by mouth every 12 (twelve) hours as needed for Anxiety or Sleep. Max Daily Amount: 10 mg   FLUOXETINE (PROZAC) 20 MG CAPSULE    Take 20 mg by mouth daily.      Past Medical History:  Diagnosis Date   Anxiety    Hypertension    Stroke [facility])     Social History   Tobacco Use   Smoking status: Every Day    Packs/day: 0.50    Types: Cigarettes   Smokeless  tobacco: Never  Vaping Use   Vaping Use: Never used  Substance Use Topics   Alcohol use: No    Comment: social drinker one drink   Drug use: No    No family history on file.  Allergies  Allergen Reactions   Flexeril [Cyclobenzaprine Hcl] Other (See Comments)    Out of mind    Review of Systems  Constitutional: Negative.   Respiratory: Negative.    Cardiovascular: Negative.   Gastrointestinal: Negative.   Skin: Negative.     OBJECTIVE:    Vitals:   10/03/21 0842  BP: 114/79  Pulse: 64  Temp: (!) 96.7 F (35.9 C)  TempSrc: Temporal  Weight: 198 lb (89.8 kg)   Body mass index is 31.01 kg/m.  Physical Exam Constitutional:      General: She is not in acute distress.    Appearance: Normal appearance.  HENT:     Head: Normocephalic and atraumatic.  Eyes:     Extraocular Movements: Extraocular movements intact.     Conjunctiva/sclera: Conjunctivae normal.  Pulmonary:     Effort: Pulmonary effort is  normal.     Breath sounds: Normal breath sounds.  Abdominal:     General: Bowel sounds are normal.     Palpations: Abdomen is soft.  Skin:    General: Skin is warm and dry.     Coloration: Skin is not jaundiced.  Neurological:     General: No focal deficit present.     Mental Status: She is alert and oriented to person, place, and time.  Psychiatric:        Mood and Affect: Mood normal.        Behavior: Behavior normal.     Labs and Microbiology: CBC Latest Ref Rng & Units 09/19/2021 11/06/2015 11/05/2015  WBC 3.8 - 10.8 Thousand/uL 7.7 5.5 5.6  Hemoglobin 11.7 - 15.5 g/dL 12.0 12.2 11.9(L)  Hematocrit 35.0 - 45.0 % 36.0 36.2 37.1  Platelets 140 - 400 Thousand/uL 245 207 222   CMP Latest Ref Rng & Units 09/19/2021 09/19/2021 11/06/2015  Glucose 65 - 99 mg/dL 76 - 115(H)  BUN 7 - 25 mg/dL 21 - 14  Creatinine 0.50 - 1.05 mg/dL 1.41(H) - 1.29(H)  Sodium 135 - 146 mmol/L 136 - 138  Potassium 3.5 - 5.3 mmol/L 3.9 - 4.1  Chloride 98 - 110 mmol/L 101 - 103  CO2 20  - 32 mmol/L 27 - 25  Calcium 8.6 - 10.4 mg/dL 9.9 - 9.6  Total Protein 6.1 - 8.1 g/dL 7.6 - 6.5  Total Bilirubin 0.2 - 1.2 mg/dL 0.3 - 0.5  Alkaline Phos 38 - 126 U/L - - 48  AST 10 - 35 U/L 45(H) - 29  ALT 6 - 29 U/L 35(H) 33(H) 26      ASSESSMENT & PLAN:    Chronic hepatitis C without hepatic coma (HCC) Will start Mayvret daily x 8 weeks and medication given to patient today.  She was counseled on administration and side effects and need for strict adherence.  Repeat Fibrsosure today and obtain liver US with elastography.  RTC 4 weeks where she will be given her next box of medications.  Syphilis Her syphilis testing was negative and she does not require further treatment.   Vaccine counseling Will give COVID Booster and PCV 20 today.    Orders Placed This Encounter  Procedures   US ABDOMEN COMPLETE W/ELASTOGRAPHY    Standing Status:   Future    Standing Expiration Date:   04/02/2022    Order Specific Question:   Reason for Exam (SYMPTOM  OR DIAGNOSIS REQUIRED)    Answer:   hepatitis c    Order Specific Question:   Preferred imaging location?    Answer:   [facility]   Liver Fibrosis, FibroTest-ActiTest   Protime-INR         Raynelle Highland for Infectious Disease [facility] 10/03/2021, 8:54 AM

## 2021-10-03 NOTE — Patient Instructions (Signed)
Thank you for coming to see me today. It was a pleasure seeing you.  To Do: Start taking Mavyret as directed  Labs today Obtain liver ultrasound Follow up in 4 weeks  If you have any questions or concerns, please do not hesitate to call the office at [phone].  Take Care,   Jule Ser

## 2021-10-10 LAB — LIVER FIBROSIS, FIBROTEST-ACTITEST
ALT: 32 U/L — ABNORMAL HIGH (ref 6–29)
Alpha-2-Macroglobulin: 340 mg/dL — ABNORMAL HIGH (ref 106–279)
Apolipoprotein A1: 245 mg/dL — ABNORMAL HIGH (ref 101–198)
Bilirubin: 0.3 mg/dL (ref 0.2–1.2)
Fibrosis Score: 0.24
GGT: 193 U/L — ABNORMAL HIGH (ref 3–65)
Haptoglobin: 192 mg/dL (ref 43–212)
Necroinflammat ACT Score: 0.15
Reference ID: 4205822

## 2021-10-10 LAB — PROTIME-INR
INR: 1
Prothrombin Time: 10 s (ref 9.0–11.5)

## 2021-10-11 ENCOUNTER — Other Ambulatory Visit: Payer: Self-pay

## 2021-10-11 ENCOUNTER — Ambulatory Visit (HOSPITAL_COMMUNITY)
Admission: RE | Admit: 2021-10-11 | Discharge: 2021-10-11 | Disposition: A | Discharge status: Home / Self Care | Payer: Medicaid Other | Source: Ambulatory Visit | Attending: Internal Medicine | Admitting: Internal Medicine

## 2021-10-11 DIAGNOSIS — B182 Chronic viral hepatitis C: Secondary | ICD-10-CM | POA: Insufficient documentation

## 2021-10-13 ENCOUNTER — Telehealth: Payer: Self-pay

## 2021-10-13 NOTE — Telephone Encounter (Signed)
-----   Message from Mignon Pine, DO sent at 10/13/2021  1:05 PM EST ----- Please let patient know that her [place] and blood work did not show findings of significant fibrosis or scarring from her hepatitis C infection.  She needs to continue with Mavyret for HCV treatment x 8 weeks total and keep her f/u appt with me later this month.  Thanks

## 2021-10-13 NOTE — Telephone Encounter (Signed)
Patient informed of [place] and lab results and verbalized understanding.  Amanda Moody T [name]

## 2021-10-24 ENCOUNTER — Other Ambulatory Visit (HOSPITAL_COMMUNITY): Payer: Self-pay

## 2021-11-06 ENCOUNTER — Ambulatory Visit (INDEPENDENT_AMBULATORY_CARE_PROVIDER_SITE_OTHER): Payer: Medicaid Other | Admitting: Internal Medicine

## 2021-11-06 ENCOUNTER — Other Ambulatory Visit: Payer: Self-pay

## 2021-11-06 ENCOUNTER — Encounter: Payer: Self-pay | Admitting: Internal Medicine

## 2021-11-06 VITALS — BP 122/78 | HR 76 | Resp 16 | Ht 67.0 in | Wt 193.0 lb

## 2021-11-06 DIAGNOSIS — R7989 Other specified abnormal findings of blood chemistry: Secondary | ICD-10-CM | POA: Insufficient documentation

## 2021-11-06 DIAGNOSIS — B182 Chronic viral hepatitis C: Secondary | ICD-10-CM | POA: Diagnosis present

## 2021-11-06 DIAGNOSIS — Z7185 Encounter for immunization safety counseling: Secondary | ICD-10-CM

## 2021-11-06 NOTE — Assessment & Plan Note (Signed)
She is immune to hepatitis A and B so vaccination not needed.

## 2021-11-06 NOTE — Patient Instructions (Signed)
Thank you for coming to see me today. It was a pleasure seeing you.  To Do: Continue mayvret for full 8 week course Follow up in 4 weeks  If you have any questions or concerns, please do not hesitate to call the office at [phone].  Take Care,   Gwynn Burly

## 2021-11-06 NOTE — Assessment & Plan Note (Signed)
Will repeat LFTs today. 

## 2021-11-06 NOTE — Assessment & Plan Note (Signed)
Patient currently on Mavyret x8 weeks and is about halfway through her treatment regimen.  We will continue this for the whole 8-week duration.  Repeat HCV RNA and LFTs today.  Adherence reinforced with patient.  RTC 4 weeks at the end of therapy.  Her pretreatment fibrosis score was minimal and so she will not require ongoing liver monitoring after treatment.

## 2021-11-06 NOTE — Progress Notes (Signed)
[place] for Infectious Disease  CHIEF COMPLAINT:    Follow up for hepatitis C  SUBJECTIVE:    Amanda Moody is a 61 y.o. female with PMHx as below who presents to the clinic for hepatitis C.   HCV Ab reactive Genotype 1b  HCV RNA 23,500,000 copies  Hep A and B immune  FIB-4 1.92 Fibrosis score F0-F1  Patient is currently on [place] x8 weeks.  She has tolerated well so far without any side effects.  She reports 100% adherence.  She has noted some weight loss and increased GERD but continues on her Protonix.   Please see A&P for the details of today's visit and status of the patient's medical problems.   Patient's Medications  New Prescriptions   No medications on file  Previous Medications   DIAZEPAM (VALIUM) 5 MG TABLET    Take 5 mg by mouth 2 (two) times daily as needed.   FLUOXETINE (PROZAC) 40 MG CAPSULE    Take 40 mg by mouth daily.   GABAPENTIN (NEURONTIN) 300 MG CAPSULE    Take 300 mg by mouth 3 (three) times daily.   GLECAPREVIR-PIBRENTASVIR (MAVYRET) 100-40 MG TABS    Take 3 tablets by mouth daily with breakfast.   LISINOPRIL-HYDROCHLOROTHIAZIDE (ZESTORETIC) 10-12.5 MG TABLET    Take 1 tablet by mouth daily.   METHADONE (DOLOPHINE) 10 MG TABLET    Take 140 mg by mouth daily.   OMEPRAZOLE (PRILOSEC) 40 MG CAPSULE    Take 40 mg by mouth daily.   PANTOPRAZOLE (PROTONIX) 40 MG TABLET    Take 40 mg by mouth daily.  Modified Medications   No medications on file  Discontinued Medications   CLONAZEPAM (KLONOPIN) 1 MG TABLET    Take 1 mg by mouth 2 (two) times daily as needed.      Past Medical History:  Diagnosis Date   Anxiety    Hypertension    Stroke [facility])     Social History   Tobacco Use   Smoking status: Every Day    Packs/day: 0.50    Types: Cigarettes   Smokeless tobacco: Never  Vaping Use   Vaping Use: Never used  Substance Use Topics   Alcohol use: No    Comment: social drinker one drink   Drug use: No    No family history on  file.  Allergies  Allergen Reactions   Flexeril [Cyclobenzaprine Hcl] Other (See Comments)    Out of mind    Review of Systems  Constitutional: Negative.   Respiratory: Negative.    Cardiovascular: Negative.   Gastrointestinal:  Positive for heartburn.  All other systems reviewed and are negative.   OBJECTIVE:    Vitals:   11/06/21 1420  BP: 122/78  Pulse: 76  Resp: 16  SpO2: 100%  Weight: 193 lb (87.5 kg)  Height: 5\' 7"  (1.702 m)   Body mass index is 30.23 kg/m.  Physical Exam Constitutional:      Appearance: Normal appearance.  HENT:     Head: Normocephalic and atraumatic.  Pulmonary:     Effort: Pulmonary effort is normal. No respiratory distress.  Musculoskeletal:        General: Normal range of motion.  Skin:    General: Skin is warm and dry.     Coloration: Skin is not jaundiced.  Neurological:     General: No focal deficit present.     Mental Status: She is alert and oriented to person, place, and time.  Labs and Microbiology: CBC Latest Ref Rng & Units 09/19/2021 11/06/2015 11/05/2015  WBC 3.8 - 10.8 Thousand/uL 7.7 5.5 5.6  Hemoglobin 11.7 - 15.5 g/dL 12.0 12.2 11.9(L)  Hematocrit 35.0 - 45.0 % 36.0 36.2 37.1  Platelets 140 - 400 Thousand/uL 245 207 222   CMP Latest Ref Rng & Units 10/03/2021 09/19/2021 09/19/2021  Glucose 65 - 99 mg/dL - 76 -  BUN 7 - 25 mg/dL - 21 -  Creatinine 0.50 - 1.05 mg/dL - 1.41(H) -  Sodium 135 - 146 mmol/L - 136 -  Potassium 3.5 - 5.3 mmol/L - 3.9 -  Chloride 98 - 110 mmol/L - 101 -  CO2 20 - 32 mmol/L - 27 -  Calcium 8.6 - 10.4 mg/dL - 9.9 -  Total Protein 6.1 - 8.1 g/dL - 7.6 -  Total Bilirubin 0.2 - 1.2 mg/dL - 0.3 -  Alkaline Phos 38 - 126 U/L - - -  AST 10 - 35 U/L - 45(H) -  ALT 6 - 29 U/L 32(H) 35(H) 33(H)        ASSESSMENT & PLAN:    Chronic hepatitis C without hepatic coma (HCC) Patient currently on Mavyret x8 weeks and is about halfway through her treatment regimen.  We will continue this for the  whole 8-week duration.  Repeat HCV RNA and LFTs today.  Adherence reinforced with patient.  RTC 4 weeks at the end of therapy.  Her pretreatment fibrosis score was minimal and so she will not require ongoing liver monitoring after treatment.  Elevated LFTs Will repeat LFTs today.  Vaccine counseling She is immune to hepatitis A and B so vaccination not needed.    Orders Placed This Encounter  Procedures   Hepatic function panel   Hepatitis C RNA quantitative       Raynelle Highland for Infectious Disease [facility] 11/06/2021, 2:34 PM

## 2021-11-08 ENCOUNTER — Telehealth: Payer: Self-pay

## 2021-11-08 LAB — HEPATIC FUNCTION PANEL
AG Ratio: 1 (calc) (ref 1.0–2.5)
ALT: 18 U/L (ref 6–29)
AST: 24 U/L (ref 10–35)
Albumin: 4 g/dL (ref 3.6–5.1)
Alkaline phosphatase (APISO): 62 U/L (ref 37–153)
Bilirubin, Direct: 0.1 mg/dL (ref 0.0–0.2)
Globulin: 3.9 g/dL (calc) — ABNORMAL HIGH (ref 1.9–3.7)
Indirect Bilirubin: 0.2 mg/dL (calc) (ref 0.2–1.2)
Total Bilirubin: 0.3 mg/dL (ref 0.2–1.2)
Total Protein: 7.9 g/dL (ref 6.1–8.1)

## 2021-11-08 LAB — HEPATITIS C RNA QUANTITATIVE
HCV Quantitative Log: <1.18 log IU/mL
HCV RNA, PCR, QN: <15 IU/mL

## 2021-11-08 NOTE — Telephone Encounter (Signed)
-----   Message from Kathlynn Grate, DO sent at 11/08/2021  4:03 PM EST ----- ?Please let patient know that her LFTs have normalized and her HCV viral load is undetectable.  She should continue with Mavyret to complete full 8 week course and follow up with me as scheduled. ? ?Thanks ?

## 2021-11-08 NOTE — Telephone Encounter (Signed)
Spoke with patient, relayed that her LFTs have normalized and her HCV viral load is undetectable which is great news. Advised her that Dr. Earlene Plater would like for her to continue with her Mavyret to complete her 8 weeks of treatment and follow up as scheduled. Patient verbalized understanding and has no further questions.  ? ?Sandie Ano, RN ? ?

## 2021-11-15 ENCOUNTER — Other Ambulatory Visit (HOSPITAL_COMMUNITY): Payer: Self-pay

## 2021-11-23 ENCOUNTER — Other Ambulatory Visit (HOSPITAL_COMMUNITY): Payer: Self-pay

## 2021-12-06 ENCOUNTER — Ambulatory Visit: Payer: Medicaid Other | Admitting: Internal Medicine

## 2021-12-06 NOTE — Progress Notes (Deleted)
?  ? ? ? ? ?[facility] ? ?CHIEF COMPLAINT:   ? ?Follow up for hepatitis C ? ?SUBJECTIVE:   ? ?Amanda Moody is a 61 y.o. female with PMHx as below who presents to the clinic for hepatitis C.  ? ?Pre-treatment labs: ?HCV Ab reactive ?Genotype 1b  ?HCV RNA 23,500,000 copies  ?Hep A and B immune  ?FIB-4 1.92 ?Fibrosis score F0-F1 ?  ?She is here for routine follow up and completing her course of Mavyret x 8 weeks.  Labs last month showed normalization of her LFTs and HCV RNA undetectable. ? ?Please see A&P for the details of today's visit and status of the patient's medical problems.  ? ?Patient's Medications  ?New Prescriptions  ? No medications on file  ?Previous Medications  ? DIAZEPAM (VALIUM) 5 MG TABLET    Take 5 mg by mouth 2 (two) times daily as needed.  ? FLUOXETINE (PROZAC) 40 MG CAPSULE    Take 40 mg by mouth daily.  ? GABAPENTIN (NEURONTIN) 300 MG CAPSULE    Take 300 mg by mouth 3 (three) times daily.  ? GLECAPREVIR-PIBRENTASVIR (MAVYRET) 100-40 MG TABS    Take 3 tablets by mouth daily with breakfast.  ? LISINOPRIL-HYDROCHLOROTHIAZIDE (ZESTORETIC) 10-12.5 MG TABLET    Take 1 tablet by mouth daily.  ? METHADONE (DOLOPHINE) 10 MG TABLET    Take 140 mg by mouth daily.  ? OMEPRAZOLE (PRILOSEC) 40 MG CAPSULE    Take 40 mg by mouth daily.  ? PANTOPRAZOLE (PROTONIX) 40 MG TABLET    Take 40 mg by mouth daily.  ?Modified Medications  ? No medications on file  ?Discontinued Medications  ? No medications on file  ?   ? ?Past Medical History:  ?Diagnosis Date  ? Anxiety   ? Hypertension   ? Stroke [facility])   ? ? ?Social History  ? ?Tobacco Use  ? Smoking status: Every Day  ?  Packs/day: 0.50  ?  Types: Cigarettes  ? Smokeless tobacco: Never  ?Vaping Use  ? Vaping Use: Never used  ?Substance Use Topics  ? Alcohol use: No  ?  Comment: social drinker one drink  ? Drug use: No  ? ? ?No family history on file. ? ?Allergies  ?Allergen Reactions  ? Flexeril [Cyclobenzaprine Hcl] Other (See Comments)  ?   Out of mind  ? ? ?ROS ? ? ?OBJECTIVE:   ? ?There were no vitals filed for this visit. ?There is no height or weight on file to calculate BMI. ? ?Physical Exam ? ? ?Labs and Microbiology: ? ?  Latest Ref Rng & Units 09/19/2021  ? 10:22 AM 11/06/2015  ?  5:28 AM 11/05/2015  ?  8:10 PM  ?CBC  ?WBC 3.8 - 10.8 Thousand/uL 7.7   5.5   5.6    ?Hemoglobin 11.7 - 15.5 g/dL 17.4   94.4   96.7    ?Hematocrit 35.0 - 45.0 % 36.0   36.2   37.1    ?Platelets 140 - 400 Thousand/uL 245   207   222    ? ? ?  Latest Ref Rng & Units 11/06/2021  ?  2:36 PM 10/03/2021  ?  9:08 AM 09/19/2021  ? 10:22 AM  ?CMP  ?Glucose 65 - 99 mg/dL   76    ?BUN 7 - 25 mg/dL   21    ?Creatinine 0.50 - 1.05 mg/dL   5.91    ?Sodium 135 - 146 mmol/L   136    ?  Potassium 3.5 - 5.3 mmol/L   3.9    ?Chloride 98 - 110 mmol/L   101    ?CO2 20 - 32 mmol/L   27    ?Calcium 8.6 - 10.4 mg/dL   9.9    ?Total Protein 6.1 - 8.1 g/dL 7.9    7.6    ?Total Bilirubin 0.2 - 1.2 mg/dL 0.3    0.3    ?AST 10 - 35 U/L 24    45    ?ALT 6 - 29 U/L 18   32   33    ? 35    ?  ? ?No results found for this or any previous visit (from the past 240 hour(s)). ? ?Imaging: ?*** ? ? ?ASSESSMENT & PLAN:   ? ?No problem-specific Assessment & Plan notes found for this encounter. ? ? ?No orders of the defined types were placed in this encounter. ?  ? ?There are no diagnoses linked to this encounter. ? ?Patient here today at conclusion of her 8-week Mavyret course.  Will check HCV RNA and LFTs today.  RTC 12 weeks for cure visit.   Her pretreatment Fibrosis score was minimal and so she will not require ongoing liver monitoring after treatment.  ? ?Will check LFTs today. ? ? ?Kathlynn Grate ?[facility] ?[facility] ?12/06/2021, 4:40 AM ? ? ? ?

## 2021-12-22 ENCOUNTER — Encounter: Payer: Self-pay | Admitting: Physician Assistant

## 2022-01-08 NOTE — Progress Notes (Deleted)
[facility]  CHIEF COMPLAINT:    Follow up for HCV  SUBJECTIVE:    Amanda Moody is a 61 y.o. female with PMHx as below who presents to the clinic for hepatitis C.   Pre-Treatment: Genotype 1b  HCV RNA 23,500,000 copies  Hep A and B immune  FIB-4 1.92 Fibrosis score F0-F1  Patient last seen on 11/06/21 at which point her HCV RNA was undetectable and she has since completed her 8 week course of Mavyret.  She missed her prior end of treatment visit on 12/06/21 and presents today for follow up.  She had no issues taking or tolerating her treatment and had no missed doses.     Please see A&P for the details of today's visit and status of the patient's medical problems.   Patient's Medications  New Prescriptions   No medications on file  Previous Medications   DIAZEPAM (VALIUM) 5 MG TABLET    Take 5 mg by mouth 2 (two) times daily as needed.   FLUOXETINE (PROZAC) 40 MG CAPSULE    Take 40 mg by mouth daily.   GABAPENTIN (NEURONTIN) 300 MG CAPSULE    Take 300 mg by mouth 3 (three) times daily.   GLECAPREVIR-PIBRENTASVIR (MAVYRET) 100-40 MG TABS    Take 3 tablets by mouth daily with breakfast.   LISINOPRIL-HYDROCHLOROTHIAZIDE (ZESTORETIC) 10-12.5 MG TABLET    Take 1 tablet by mouth daily.   METHADONE (DOLOPHINE) 10 MG TABLET    Take 140 mg by mouth daily.   OMEPRAZOLE (PRILOSEC) 40 MG CAPSULE    Take 40 mg by mouth daily.   PANTOPRAZOLE (PROTONIX) 40 MG TABLET    Take 40 mg by mouth daily.  Modified Medications   No medications on file  Discontinued Medications   No medications on file      Past Medical History:  Diagnosis Date   Anxiety    Hypertension    Stroke [facility])     Social History   Tobacco Use   Smoking status: Every Day    Packs/day: 0.50    Types: Cigarettes   Smokeless tobacco: Never  Vaping Use   Vaping Use: Never used  Substance Use Topics   Alcohol use: No    Comment: social drinker one drink   Drug use: No    No  family history on file.  Allergies  Allergen Reactions   Flexeril [Cyclobenzaprine Hcl] Other (See Comments)    Out of mind    ROS   OBJECTIVE:    There were no vitals filed for this visit. There is no height or weight on file to calculate BMI.  Physical Exam   Labs and Microbiology:    Latest Ref Rng & Units 09/19/2021   10:22 AM 11/06/2015    5:28 AM 11/05/2015    8:10 PM  CBC  WBC 3.8 - 10.8 Thousand/uL 7.7   5.5   5.6    Hemoglobin 11.7 - 15.5 g/dL 39.7   67.3   41.9    Hematocrit 35.0 - 45.0 % 36.0   36.2   37.1    Platelets 140 - 400 Thousand/uL 245   207   222        Latest Ref Rng & Units 11/06/2021    2:36 PM 10/03/2021    9:08 AM 09/19/2021   10:22 AM  CMP  Glucose 65 - 99 mg/dL   76    BUN 7 - 25 mg/dL  21    Creatinine 0.50 - 1.05 mg/dL   3.78    Sodium 588 - 146 mmol/L   136    Potassium 3.5 - 5.3 mmol/L   3.9    Chloride 98 - 110 mmol/L   101    CO2 20 - 32 mmol/L   27    Calcium 8.6 - 10.4 mg/dL   9.9    Total Protein 6.1 - 8.1 g/dL 7.9    7.6    Total Bilirubin 0.2 - 1.2 mg/dL 0.3    0.3    AST 10 - 35 U/L 24    45    ALT 6 - 29 U/L 18   32   33     35       No results found for this or any previous visit (from the past 240 hour(s)).  Imaging: ***   ASSESSMENT & PLAN:    No problem-specific Assessment & Plan notes found for this encounter.   No orders of the defined types were placed in this encounter.    There are no diagnoses linked to this encounter.  Patient has completed 8 weeks of therapy with Mavyret and presents at her end of treatment visit.  Will check HCV RNA today and if looks okay will follow up in 12 weeks for cure visit.    Vedia Coffer for Infectious Disease [facility] 01/08/2022, 8:03 PM

## 2022-01-09 ENCOUNTER — Ambulatory Visit: Payer: Medicaid Other | Admitting: Internal Medicine

## 2022-01-09 DIAGNOSIS — B182 Chronic viral hepatitis C: Secondary | ICD-10-CM

## 2022-01-10 ENCOUNTER — Ambulatory Visit: Payer: Medicaid Other | Admitting: Physician Assistant

## 2022-01-23 ENCOUNTER — Ambulatory Visit: Payer: Medicaid Other | Admitting: Internal Medicine

## 2022-01-23 NOTE — Progress Notes (Deleted)
[facility]  CHIEF COMPLAINT:    Follow up for HCV  SUBJECTIVE:    Amanda Moody is a 61 y.o. female with PMHx as below who presents to the clinic for hepatitis C.   Pre-Treatment: Genotype 1b  HCV RNA 23,500,000 copies  Hep A and B immune  FIB-4 1.92 Fibrosis score F0-F1  Patient last seen on 11/06/21 at which point her HCV RNA was undetectable and she has since completed her 8 week course of Mavyret.  She missed her prior end of treatment visit on 12/06/21 and presents today for follow up.  She had no issues taking or tolerating her treatment and had no missed doses.   She is about 8 weeks out from having completed therapy.     Please see A&P for the details of today's visit and status of the patient's medical problems.   Patient's Medications  New Prescriptions   No medications on file  Previous Medications   DIAZEPAM (VALIUM) 5 MG TABLET    Take 5 mg by mouth 2 (two) times daily as needed.   FLUOXETINE (PROZAC) 40 MG CAPSULE    Take 40 mg by mouth daily.   GABAPENTIN (NEURONTIN) 300 MG CAPSULE    Take 300 mg by mouth 3 (three) times daily.   GLECAPREVIR-PIBRENTASVIR (MAVYRET) 100-40 MG TABS    Take 3 tablets by mouth daily with breakfast.   LISINOPRIL-HYDROCHLOROTHIAZIDE (ZESTORETIC) 10-12.5 MG TABLET    Take 1 tablet by mouth daily.   METHADONE (DOLOPHINE) 10 MG TABLET    Take 140 mg by mouth daily.   OMEPRAZOLE (PRILOSEC) 40 MG CAPSULE    Take 40 mg by mouth daily.   PANTOPRAZOLE (PROTONIX) 40 MG TABLET    Take 40 mg by mouth daily.  Modified Medications   No medications on file  Discontinued Medications   No medications on file      Past Medical History:  Diagnosis Date   Anxiety    Hypertension    Stroke [facility])     Social History   Tobacco Use   Smoking status: Every Day    Packs/day: 0.50    Types: Cigarettes   Smokeless tobacco: Never  Vaping Use   Vaping Use: Never used  Substance Use Topics   Alcohol use: No     Comment: social drinker one drink   Drug use: No    No family history on file.  Allergies  Allergen Reactions   Flexeril [Cyclobenzaprine Hcl] Other (See Comments)    Out of mind    ROS   OBJECTIVE:    There were no vitals filed for this visit. There is no height or weight on file to calculate BMI.  Physical Exam   Labs and Microbiology:    Latest Ref Rng & Units 09/19/2021   10:22 AM 11/06/2015    5:28 AM 11/05/2015    8:10 PM  CBC  WBC 3.8 - 10.8 Thousand/uL 7.7   5.5   5.6    Hemoglobin 11.7 - 15.5 g/dL 35.4   65.6   81.2    Hematocrit 35.0 - 45.0 % 36.0   36.2   37.1    Platelets 140 - 400 Thousand/uL 245   207   222        Latest Ref Rng & Units 11/06/2021    2:36 PM 10/03/2021    9:08 AM 09/19/2021   10:22 AM  CMP  Glucose 65 - 99 mg/dL  76    BUN 7 - 25 mg/dL   21    Creatinine 4.74 - 1.05 mg/dL   2.59    Sodium 563 - 146 mmol/L   136    Potassium 3.5 - 5.3 mmol/L   3.9    Chloride 98 - 110 mmol/L   101    CO2 20 - 32 mmol/L   27    Calcium 8.6 - 10.4 mg/dL   9.9    Total Protein 6.1 - 8.1 g/dL 7.9    7.6    Total Bilirubin 0.2 - 1.2 mg/dL 0.3    0.3    AST 10 - 35 U/L 24    45    ALT 6 - 29 U/L 18   32   33     35       No results found for this or any previous visit (from the past 240 hour(s)).  Imaging: ***   ASSESSMENT & PLAN:    No problem-specific Assessment & Plan notes found for this encounter.   No orders of the defined types were placed in this encounter.    There are no diagnoses linked to this encounter.  Patient has completed 8 weeks of therapy with Mavyret and presents at her end of treatment visit.  Will check HCV RNA today and if looks okay will follow up in 12 weeks for cure visit.    Vedia Coffer for Infectious Disease [facility] 01/23/2022, 5:16 AM

## 2022-05-03 ENCOUNTER — Emergency Department (HOSPITAL_COMMUNITY): Payer: Medicaid Other

## 2022-05-03 ENCOUNTER — Other Ambulatory Visit: Payer: Self-pay

## 2022-05-03 ENCOUNTER — Emergency Department (HOSPITAL_COMMUNITY)
Admission: EM | Admit: 2022-05-03 | Discharge: 2022-05-03 | Disposition: A | Discharge status: Home / Self Care | Payer: Medicaid Other | Attending: Emergency Medicine | Admitting: Emergency Medicine

## 2022-05-03 DIAGNOSIS — Z79899 Other long term (current) drug therapy: Secondary | ICD-10-CM | POA: Diagnosis not present

## 2022-05-03 DIAGNOSIS — M79671 Pain in right foot: Secondary | ICD-10-CM | POA: Diagnosis present

## 2022-05-03 DIAGNOSIS — I10 Essential (primary) hypertension: Secondary | ICD-10-CM | POA: Insufficient documentation

## 2022-05-03 DIAGNOSIS — E119 Type 2 diabetes mellitus without complications: Secondary | ICD-10-CM | POA: Diagnosis not present

## 2022-05-03 LAB — CBC WITH DIFFERENTIAL/PLATELET
Abs Immature Granulocytes: 0.03 10*3/uL (ref 0.00–0.07)
Basophils Absolute: 0.1 10*3/uL (ref 0.0–0.1)
Basophils Relative: 1 %
Eosinophils Absolute: 0.1 10*3/uL (ref 0.0–0.5)
Eosinophils Relative: 1 %
HCT: 38.1 % (ref 36.0–46.0)
Hemoglobin: 12.2 g/dL (ref 12.0–15.0)
Immature Granulocytes: 0 %
Lymphocytes Relative: 32 %
Lymphs Abs: 2.6 10*3/uL (ref 0.7–4.0)
MCH: 31.4 pg (ref 26.0–34.0)
MCHC: 32 g/dL (ref 30.0–36.0)
MCV: 98.2 fL (ref 80.0–100.0)
Monocytes Absolute: 0.7 10*3/uL (ref 0.1–1.0)
Monocytes Relative: 8 %
Neutro Abs: 4.8 10*3/uL (ref 1.7–7.7)
Neutrophils Relative %: 58 %
Platelets: 222 10*3/uL (ref 150–400)
RBC: 3.88 MIL/uL (ref 3.87–5.11)
RDW: 13.5 % (ref 11.5–15.5)
WBC: 8.2 10*3/uL (ref 4.0–10.5)
nRBC: 0 % (ref 0.0–0.2)

## 2022-05-03 LAB — COMPREHENSIVE METABOLIC PANEL
ALT: 21 U/L (ref 0–44)
AST: 26 U/L (ref 15–41)
Albumin: 3.7 g/dL (ref 3.5–5.0)
Alkaline Phosphatase: 49 U/L (ref 38–126)
Anion gap: 13 (ref 5–15)
BUN: 20 mg/dL (ref 8–23)
CO2: 21 mmol/L — ABNORMAL LOW (ref 22–32)
Calcium: 10 mg/dL (ref 8.9–10.3)
Chloride: 103 mmol/L (ref 98–111)
Creatinine, Ser: 1.55 mg/dL — ABNORMAL HIGH (ref 0.44–1.00)
GFR, Estimated: 37 mL/min — ABNORMAL LOW (ref >60)
Glucose, Bld: 136 mg/dL — ABNORMAL HIGH (ref 70–99)
Potassium: 3.4 mmol/L — ABNORMAL LOW (ref 3.5–5.1)
Sodium: 137 mmol/L (ref 135–145)
Total Bilirubin: 0.4 mg/dL (ref 0.3–1.2)
Total Protein: 7.5 g/dL (ref 6.5–8.1)

## 2022-05-03 LAB — LACTIC ACID, PLASMA
Lactic Acid, Venous: 4.8 mmol/L (ref 0.5–1.9)
Lactic Acid, Venous: 1.4 mmol/L (ref 0.5–1.9)

## 2022-05-03 NOTE — ED Triage Notes (Signed)
PT reports increase left sided foot pain. Pt reports that she scraped her foot and then it started to turn black and hurt ever since. Pt denies fevers but endorses increase fatigue. Pt also now endorses right sided foot pain as well.   HX: DM, HTN

## 2022-05-03 NOTE — Discharge Instructions (Signed)
You were seen today for foot pain.  Your blood work and imaging studies were reassuring and did not show any signs of infection or broken bones.  Please follow-up with your primary care doctor in the next 2 to 3 days regarding today's visit and your symptoms.  You should also see your podiatrist regarding today's symptoms. Please return to the emergency department if you experience any chest pain, difficulty breathing, abdominal pain, altered mental status, severe fever, or any other concerning symptom.  Thank you for allowing [place] to participate in your care.

## 2022-05-03 NOTE — ED Notes (Signed)
Patient transported to X-ray 

## 2022-05-03 NOTE — ED Provider Notes (Signed)
[facility] EMERGENCY DEPARTMENT Provider Note   CSN: [id number] Arrival date & time: 05/03/22  0254     History  Chief Complaint  Patient presents with   Foot Pain    Amanda Moody is a 61 y.o. female PMH hypertension, hyperlipidemia, rheumatoid arthritis, hepatitis C who is presenting with foot pain.  Patient reports that approximately 3 weeks ago, she was wearing a pair cumbers, which she believes rubbed and irritated the bunion on her left foot.  Since then, the bunion has darkened in color compared to the rest of her skin.  She denies any tenderness over the area or any pain on the left foot.  She has noticed over the last week, that her right foot has begun hurting and has been mildly swollen.  She localizes the pain is worse over the lateral aspect of the foot extending down towards the pinky toe.  Patient reports some "aching" in her toes bilaterally, that is worse in the morning.  She denies any fevers, chest pain, difficulty breathing, abdominal pain.   Home Medications Prior to Admission medications   Medication Sig Start Date End Date Taking? Authorizing Provider  diazepam (VALIUM) 5 MG tablet Take 5 mg by mouth 2 (two) times daily as needed. 09/16/21   [provider]  FLUoxetine (PROZAC) 40 MG capsule Take 40 mg by mouth daily. 09/16/21   [provider]  gabapentin (NEURONTIN) 300 MG capsule Take 300 mg by mouth 3 (three) times daily. 09/20/21   [provider]  Glecaprevir-Pibrentasvir (MAVYRET) 100-40 MG TABS Take 3 tablets by mouth daily with breakfast. 09/28/21   Jennette Kettle, RPH-CPP  lisinopril-hydrochlorothiazide (ZESTORETIC) 10-12.5 MG tablet Take 1 tablet by mouth daily. 11/07/15   Meredeth Ide, MD  methadone (DOLOPHINE) 10 MG tablet Take 140 mg by mouth daily.    [provider]  omeprazole (PRILOSEC) 40 MG capsule Take 40 mg by mouth daily. 10/02/15   [provider]  pantoprazole (PROTONIX) 40 MG tablet  Take 40 mg by mouth daily. 04/21/21   [provider]      Allergies    Flexeril [cyclobenzaprine hcl]    Review of Systems   Review of Systems As in HPI.  Physical Exam Updated Vital Signs BP 112/75 (BP Location: Left Arm)   Pulse 98   Temp 98.4 F (36.9 C) (Oral)   Resp 20   SpO2 96%  Physical Exam Vitals and nursing note reviewed.  Constitutional:      General: She is not in acute distress.    Appearance: Normal appearance. She is well-developed. She is not ill-appearing or diaphoretic.  HENT:     Head: Normocephalic and atraumatic.     Right Ear: External ear normal.     Left Ear: External ear normal.     Nose: Nose normal.     Mouth/Throat:     Mouth: Mucous membranes are moist.  Cardiovascular:     Rate and Rhythm: Normal rate and regular rhythm.     Heart sounds: No murmur heard. Pulmonary:     Effort: Pulmonary effort is normal. No respiratory distress.     Breath sounds: Normal breath sounds. No wheezing.  Abdominal:     Palpations: Abdomen is soft.  Musculoskeletal:        General: Tenderness present.     Cervical back: Neck supple.     Right lower leg: No edema.     Left lower leg: No edema.  Comments: Bunion on base of left first metatarsal that is darker in color than surrounding skin.  No warmth, erythema to the area.  No significant tenderness to palpation over the left foot.  Right foot is tender to palpation over the posterior tip of the lateral malleolus and the base of the fifth metatarsal. 1+ DP pulses bilaterally with dopplerable DP pulses.  Skin:    General: Skin is warm and dry.  Neurological:     Mental Status: She is alert.     ED Results / Procedures / Treatments   Labs (all labs ordered are listed, but only abnormal results are displayed) Labs Reviewed  LACTIC ACID, PLASMA  LACTIC ACID, PLASMA  COMPREHENSIVE METABOLIC PANEL  CBC WITH DIFFERENTIAL/PLATELET  URINALYSIS, ROUTINE W REFLEX MICROSCOPIC     EKG None  Radiology No results found.  Procedures Procedures   Medications Ordered in ED Medications - No data to display  ED Course/ Medical Decision Making/ A&P                           Medical Decision Making Amount and/or Complexity of Data Reviewed Labs: ordered. Radiology: ordered.   Amanda Moody is a 61 y.o. female with significant PMHx DM, HTN, HLD who presented to the ED with bilateral foot pain.  Vitals at presentation within normal limits.  Patient is hemodynamically stable, afebrile, satting well on room air.  Physical exam reveals normal heart sounds.  Lungs clear to auscultation bilaterally.  Abdomen is soft, nontender to palpation.  No pitting edema of bilateral lower extremities.  Reduced but faintly palpable DP pulses bilaterally. Dopplerable pulses with triphasic waveforms heard. Tenderness to palpation over the right foot as documented in physical exam.  Initial differential includes but is not limited to: Muscular strain, ankle sprain, fracture, rheumatoid arthritis, osteoarthritis, degenerative changes, arterial disease  Lab work obtained, resulted notable for CMP with potassium 3.4, bicarb 21, creatinine 1.55.  Creatinine not significantly elevated from prior.  CBC without leukocytosis or anemia.  Lactic acid obtained in triage.  Initially resulted elevated at 4.8.  Unclear if hemolyzed, which I suspect is likely given patient overall appears well, has palpable pulses in her feet.  Doppler was taken to patient, and triphasic waveforms heard bilaterally along dorsalis pedis pulses.  Lactic was repeated without intervention, resulted within normal limits at 1.4.  Imaging was pursued, notable for x-ray bilateral feet without evidence of acute fracture but with mild to moderate degenerative changes per radiology.  I discussed the importance of smoking cessation regarding her suspected arterial disease.  Patient verbalized understanding and says that she has been  working to quit.  I also stressed the importance of follow-up with her primary care physician regarding today's visit.  Patient verbalized understanding and had no additional questions.    The patient is safe and stable for discharge at this time with return precautions provided and a plan for follow-up care in place as needed.  The plan for this patient was discussed with Dr. Rosalia Hammers, who voiced agreement and who oversaw evaluation and treatment of this patient.   Final Clinical Impression(s) / ED Diagnoses Final diagnoses:  Foot pain, right    Rx / DC Orders ED Discharge Orders     None         Skeet Simmer, MD 05/03/22 1006    Margarita Grizzle, MD 05/03/22 [phone]

## 2022-08-08 ENCOUNTER — Emergency Department (HOSPITAL_COMMUNITY): Payer: Medicaid Other

## 2022-08-08 ENCOUNTER — Emergency Department (HOSPITAL_COMMUNITY)
Admission: EM | Admit: 2022-08-08 | Discharge: 2022-08-08 | Disposition: A | Discharge status: Home / Self Care | Payer: Medicaid Other | Attending: Emergency Medicine | Admitting: Emergency Medicine

## 2022-08-08 ENCOUNTER — Other Ambulatory Visit: Payer: Self-pay

## 2022-08-08 DIAGNOSIS — Y939 Activity, unspecified: Secondary | ICD-10-CM | POA: Diagnosis not present

## 2022-08-08 DIAGNOSIS — M7042 Prepatellar bursitis, left knee: Secondary | ICD-10-CM | POA: Insufficient documentation

## 2022-08-08 DIAGNOSIS — M25562 Pain in left knee: Secondary | ICD-10-CM | POA: Diagnosis present

## 2022-08-08 LAB — CBC WITH DIFFERENTIAL/PLATELET
Abs Immature Granulocytes: 0.04 10*3/uL (ref 0.00–0.07)
Basophils Absolute: 0 10*3/uL (ref 0.0–0.1)
Basophils Relative: 0 %
Eosinophils Absolute: 0.1 10*3/uL (ref 0.0–0.5)
Eosinophils Relative: 1 %
HCT: 33.3 % — ABNORMAL LOW (ref 36.0–46.0)
Hemoglobin: 11.1 g/dL — ABNORMAL LOW (ref 12.0–15.0)
Immature Granulocytes: 0 %
Lymphocytes Relative: 26 %
Lymphs Abs: 2.6 10*3/uL (ref 0.7–4.0)
MCH: 31.2 pg (ref 26.0–34.0)
MCHC: 33.3 g/dL (ref 30.0–36.0)
MCV: 93.5 fL (ref 80.0–100.0)
Monocytes Absolute: 1.1 10*3/uL — ABNORMAL HIGH (ref 0.1–1.0)
Monocytes Relative: 11 %
Neutro Abs: 6.1 10*3/uL (ref 1.7–7.7)
Neutrophils Relative %: 62 %
Platelets: 313 10*3/uL (ref 150–400)
RBC: 3.56 MIL/uL — ABNORMAL LOW (ref 3.87–5.11)
RDW: 13 % (ref 11.5–15.5)
WBC: 9.9 10*3/uL (ref 4.0–10.5)
nRBC: 0 % (ref 0.0–0.2)

## 2022-08-08 LAB — BASIC METABOLIC PANEL
Anion gap: 14 (ref 5–15)
BUN: 15 mg/dL (ref 8–23)
CO2: 21 mmol/L — ABNORMAL LOW (ref 22–32)
Calcium: 10 mg/dL (ref 8.9–10.3)
Chloride: 100 mmol/L (ref 98–111)
Creatinine, Ser: 1.16 mg/dL — ABNORMAL HIGH (ref 0.44–1.00)
GFR, Estimated: 53 mL/min — ABNORMAL LOW (ref >60)
Glucose, Bld: 130 mg/dL — ABNORMAL HIGH (ref 70–99)
Potassium: 3.7 mmol/L (ref 3.5–5.1)
Sodium: 135 mmol/L (ref 135–145)

## 2022-08-08 MED ORDER — PREDNISONE 20 MG PO TABS: 40.0000 mg | ORAL_TABLET | Freq: Every day | ORAL | 0 refills | Status: DC

## 2022-08-08 MED ORDER — PREDNISONE 20 MG PO TABS
60.0000 mg | ORAL_TABLET | ORAL | Status: AC
  Administered 2022-08-08: 60 mg via ORAL
  Filled 2022-08-08: qty 3

## 2022-08-08 MED ORDER — DICLOFENAC SODIUM 1 % EX CREA: 1.0000 | TOPICAL_CREAM | Freq: Two times a day (BID) | CUTANEOUS | 0 refills | Status: DC

## 2022-08-08 MED ORDER — HYDROCODONE-ACETAMINOPHEN 5-325 MG PO TABS
1.0000 | ORAL_TABLET | Freq: Once | ORAL | Status: AC
  Administered 2022-08-08: 1 via ORAL
  Filled 2022-08-08: qty 1

## 2022-08-08 NOTE — ED Provider Triage Note (Signed)
Emergency Medicine Provider Triage Evaluation Note  Tsering Leaman , a 61 y.o. female  was evaluated in triage.  Pt complains of knee pain.  She is accompanied by her husband.  She has had several falls.  Over the past week she has had increasing pain, worse with activity.  There are some warmth, erythema anteriorly, but no substantial swelling.  No relief with OTC medication.  Review of Systems  Positive: Pain as above Negative: Nausea, vomiting, fever  Physical Exam  BP 131/85 (BP Location: Right Arm)   Pulse 76   Temp 98.1 F (36.7 C) (Oral)   Resp 16   SpO2 99%  Gen:   Awake, no distress speaking clearly Resp:  Normal effort no increased work of breathing MSK:   Moves extremities without difficulty patient can extend the knee against resistance, though with pain Other:  Skin: Trace erythema and warmth anterior knee  Medical Decision Making  Medically screening exam initiated at 10:59 AM.  Appropriate orders placed.  Dustee Bottenfield was informed that the remainder of the evaluation will be completed by another provider, this initial triage assessment does not replace that evaluation, and the importance of remaining in the ED until their evaluation is complete.   Gerhard Munch, MD 08/08/22 1120

## 2022-08-08 NOTE — ED Notes (Signed)
All discharge instructions including follow up care and prescriptions reviewed with patient and patient verbalized understanding of same. Patient stable and ambulatory at time of discharge.  

## 2022-08-08 NOTE — Discharge Instructions (Signed)
As discussed, you have been diagnosed with bursitis.  This is an inflammatory condition.  Typically this improves with steroids, as prescribed, and topical anti-inflammatory medicine, also as prescribed.  Do not hesitate to return here, otherwise follow-up with our orthopedic physician for appropriate ongoing care.

## 2022-08-08 NOTE — ED Provider Notes (Signed)
[facility] EMERGENCY DEPARTMENT Provider Note   CSN: [id number] Arrival date & time: 08/08/22  1045     History  Chief Complaint  Patient presents with   Knee Pain    Amanda Moody is a 61 y.o. female.  HPI Amanda Moody , a 61 y.o. female  was evaluated in triage.  Pt complains of knee pain.  She is accompanied by her husband.  She has had several falls.  Over the past week she has had increasing pain, worse with activity.  There are some warmth, erythema anteriorly, but no substantial swelling.  No relief with OTC medication.     Home Medications      Allergies    Flexeril [cyclobenzaprine hcl]    Review of Systems   Review of Systems  All other systems reviewed and are negative.   Physical Exam Updated Vital Signs BP 131/85 (BP Location: Right Arm)   Pulse 76   Temp 98.1 F (36.7 C) (Oral)   Resp 16   SpO2 99%  Physical Exam Vitals and nursing note reviewed.  Constitutional:      General: She is not in acute distress.    Appearance: She is well-developed.  HENT:     Head: Normocephalic and atraumatic.  Eyes:     Conjunctiva/sclera: Conjunctivae normal.  Cardiovascular:     Rate and Rhythm: Normal rate and regular rhythm.  Pulmonary:     Effort: Pulmonary effort is normal. No respiratory distress.     Breath sounds: Normal breath sounds. No stridor.  Abdominal:     General: There is no distension.  Musculoskeletal:       Legs:  Skin:    General: Skin is warm and dry.  Neurological:     Mental Status: She is alert and oriented to person, place, and time.     Cranial Nerves: No cranial nerve deficit.  Psychiatric:        Mood and Affect: Mood normal.     ED Results / Procedures / Treatments   Labs (all labs ordered are listed, but only abnormal results are displayed) Labs Reviewed  BASIC METABOLIC PANEL - Abnormal; Notable for the following components:      Result Value   CO2 21 (*)    Glucose, Bld 130 (*)    Creatinine, Ser  1.16 (*)    GFR, Estimated 53 (*)    All other components within normal limits  CBC WITH DIFFERENTIAL/PLATELET - Abnormal; Notable for the following components:   RBC 3.56 (*)    Hemoglobin 11.1 (*)    HCT 33.3 (*)    Monocytes Absolute 1.1 (*)    All other components within normal limits    EKG None  Radiology DG Knee Complete 4 Views Left  Result Date: 08/08/2022 CLINICAL DATA:  Pain. Fall last week with persistent diffuse left knee pain. EXAM: LEFT KNEE - COMPLETE 4+ VIEW COMPARISON:  None Available. FINDINGS: No acute or healing fracture. Normal alignment without dislocation. The joint spaces are preserved. Quadriceps and patellar tendon enthesophytes. No significant knee joint effusion. No erosion or focal bone abnormalities. Generalized soft tissue edema with prepatellar soft tissue thickening. IMPRESSION: 1. Soft tissue edema.  No fracture or dislocation. 2. Prepatellar soft tissue thickening, may represent bursitis or hematoma in the setting of injury. Electronically Signed   By: Keith Rake M.D.   On: 08/08/2022 11:45    Procedures Procedures    Medications Ordered in ED Medications  predniSONE (DELTASONE) tablet 60  mg (has no administration in time range)  HYDROcodone-acetaminophen (NORCO/VICODIN) 5-325 MG per tablet 1 tablet (1 tablet Oral Given 08/08/22 1127)    ED Course/ Medical Decision Making/ A&P                           Medical Decision Making Patient with new left knee pain distal neurovascular preserved status, and on exam is capable of flexing extending that with pain more with flexion suggesting inflammatory condition, possible patella or knee fracture as well.  X-rays reviewed, discussed, and on repeat exam the patient is awake, alert, in no distress.  Patient will start anti-inflammatory, oral, cream, have a knee sleeve, continue using cane, follow-up with orthopedics.  Amount and/or Complexity of Data Reviewed Independent Historian: spouse External  Data Reviewed: notes. Labs: ordered. Decision-making details documented in ED Course.    Details: Unremarkable labs aside from mild anemia Radiology: ordered. Decision-making details documented in ED Course.  Risk OTC drugs. Prescription drug management. Decision regarding hospitalization.   Final Clinical Impression(s) / ED Diagnoses Final diagnoses:  Prepatellar bursitis of left knee    Rx / DC Orders ED Discharge Orders          Ordered    predniSONE (DELTASONE) 20 MG tablet  Daily with breakfast        08/08/22 1400    Diclofenac Sodium 1 % CREA  2 times daily        08/08/22 1400              Gerhard Munch, MD 08/08/22 1403

## 2022-08-08 NOTE — ED Triage Notes (Signed)
Patient here with complaint of left knee pain that started after falling on November 5th. Patient here with complaint of continued pain that is now worse after falling again last week. Patient is alert, oriented, and in no apparent distress at this time.

## 2022-08-28 ENCOUNTER — Ambulatory Visit: Payer: Medicaid Other | Admitting: Orthopaedic Surgery

## 2022-09-12 ENCOUNTER — Ambulatory Visit: Payer: Medicaid Other | Admitting: Orthopaedic Surgery

## 2022-09-21 ENCOUNTER — Other Ambulatory Visit (HOSPITAL_COMMUNITY): Payer: Self-pay

## 2023-08-10 ENCOUNTER — Other Ambulatory Visit: Payer: Self-pay

## 2023-08-10 ENCOUNTER — Telehealth (HOSPITAL_COMMUNITY): Payer: Self-pay | Admitting: Physician Assistant

## 2023-08-10 ENCOUNTER — Emergency Department (HOSPITAL_COMMUNITY): Payer: MEDICAID

## 2023-08-10 ENCOUNTER — Emergency Department (HOSPITAL_COMMUNITY)
Admission: EM | Admit: 2023-08-10 | Discharge: 2023-08-10 | Disposition: A | Discharge status: Home / Self Care | Payer: MEDICAID | Attending: Emergency Medicine | Admitting: Emergency Medicine

## 2023-08-10 DIAGNOSIS — M79672 Pain in left foot: Secondary | ICD-10-CM | POA: Diagnosis present

## 2023-08-10 DIAGNOSIS — S92312A Displaced fracture of first metatarsal bone, left foot, initial encounter for closed fracture: Secondary | ICD-10-CM | POA: Insufficient documentation

## 2023-08-10 DIAGNOSIS — S82832A Other fracture of upper and lower end of left fibula, initial encounter for closed fracture: Secondary | ICD-10-CM | POA: Insufficient documentation

## 2023-08-10 DIAGNOSIS — W010XXA Fall on same level from slipping, tripping and stumbling without subsequent striking against object, initial encounter: Secondary | ICD-10-CM | POA: Insufficient documentation

## 2023-08-10 MED ORDER — HYDROCODONE-ACETAMINOPHEN 5-325 MG PO TABS: 1.0000 | ORAL_TABLET | ORAL | 0 refills | Status: DC | PRN

## 2023-08-10 MED ORDER — HYDROCODONE-ACETAMINOPHEN 5-325 MG PO TABS
1.0000 | ORAL_TABLET | Freq: Once | ORAL | Status: AC
  Administered 2023-08-10: 1 via ORAL
  Filled 2023-08-10: qty 1

## 2023-08-10 MED ORDER — HYDROCODONE-ACETAMINOPHEN 5-325 MG PO TABS: 2.0000 | ORAL_TABLET | ORAL | 0 refills | Status: DC | PRN

## 2023-08-10 NOTE — Telephone Encounter (Cosign Needed)
Pharmacy changed to [facility]

## 2023-08-10 NOTE — Telephone Encounter (Cosign Needed)
Attempted rx change.

## 2023-08-10 NOTE — Progress Notes (Signed)
Orthopedic Tech Progress Note Patient Details:  Amanda Moody December 10, 1960 [id number]  Ortho Devices Type of Ortho Device: Crutches, CAM walker Ortho Device/Splint Location: Left foot Ortho Device/Splint Interventions: Application, Adjustment   Post Interventions Patient Tolerated: Well Instructions Provided: Adjustment of device  Amanda Moody Amanda Moody 08/10/2023, 8:30 AM

## 2023-08-10 NOTE — Discharge Instructions (Addendum)
Return if any problems.

## 2023-08-10 NOTE — ED Provider Notes (Signed)
[facility] EMERGENCY DEPARTMENT AT [facility] Provider Note   CSN: [id number] Arrival date & time: 08/10/23  0630     History  Chief Complaint  Patient presents with   Leg Injury    Amanda Moody is a 62 y.o. female.  Reports that she was loading a mattress into the back of a truck and stumbled and fell.  Patient complains of pain in her left foot and her left ankle left lower leg.  Patient reports it is painful to walk.  Patient states that she did not have very much pain to begin with but it has progressively gotten worse.  Patient reports initial injury was yesterday morning but she had more swelling this a.m.  Patient denies any other area of injury.  She denies any numbness.  Patient also has some soreness in her knee.  Patient did not strike her knee.  The history is provided by the patient. No language interpreter was used.       Home Medications Prior to Admission medications   Medication Sig Start Date End Date Taking? Authorizing Provider  HYDROcodone-acetaminophen (NORCO/VICODIN) 5-325 MG tablet Take 1 tablet by mouth every 4 (four) hours as needed for moderate pain (pain score 4-6). 08/10/23 08/09/24 Yes Cheron Schaumann K, PA-C  diazepam (VALIUM) 5 MG tablet Take 5 mg by mouth 2 (two) times daily as needed. 09/16/21   [provider]  Diclofenac Sodium 1 % CREA Apply 1 Application topically in the morning and at bedtime. 08/08/22   Gerhard Munch, MD  FLUoxetine (PROZAC) 40 MG capsule Take 40 mg by mouth daily. 09/16/21   [provider]  gabapentin (NEURONTIN) 300 MG capsule Take 300 mg by mouth 3 (three) times daily. 09/20/21   [provider]  Glecaprevir-Pibrentasvir (MAVYRET) 100-40 MG TABS Take 3 tablets by mouth daily with breakfast. 09/28/21   Jennette Kettle, RPH-CPP  lisinopril-hydrochlorothiazide (ZESTORETIC) 10-12.5 MG tablet Take 1 tablet by mouth daily. 11/07/15   Meredeth Ide, MD  methadone (DOLOPHINE) 10 MG tablet Take 140 mg  by mouth daily.    [provider]  omeprazole (PRILOSEC) 40 MG capsule Take 40 mg by mouth daily. 10/02/15   [provider]  pantoprazole (PROTONIX) 40 MG tablet Take 40 mg by mouth daily. 04/21/21   [provider]  predniSONE (DELTASONE) 20 MG tablet Take 2 tablets (40 mg total) by mouth daily with breakfast. For the next four days 08/08/22   Gerhard Munch, MD      Allergies    Flexeril [cyclobenzaprine hcl]    Review of Systems   Review of Systems  Musculoskeletal:  Positive for gait problem and joint swelling.  All other systems reviewed and are negative.   Physical Exam Updated Vital Signs BP (!) 124/96   Pulse 66   Temp 98.3 F (36.8 C) (Oral)   Resp 16   Ht 5\' 7"  (1.702 m)   Wt 81.6 kg   SpO2 97%   BMI 28.19 kg/m  Physical Exam Vitals and nursing note reviewed.  Constitutional:      Appearance: She is well-developed.  HENT:     Head: Normocephalic.  Pulmonary:     Effort: Pulmonary effort is normal.  Abdominal:     General: There is no distension.  Musculoskeletal:        General: Swelling and tenderness present.     Cervical back: Normal range of motion.     Comments: Swollen tender left ankle and left foot, pain  with examination pain with range of motion neurovascular neurosensory intact.  Neurological:     General: No focal deficit present.     Mental Status: She is alert and oriented to person, place, and time.     ED Results / Procedures / Treatments   Labs (all labs ordered are listed, but only abnormal results are displayed) Labs Reviewed - No data to display  EKG None  Radiology DG Ankle Complete Left  Result Date: 08/10/2023 CLINICAL DATA:  62 year old female with history of twisting injury to the left foot and ankle. Pain. EXAM: LEFT ANKLE COMPLETE - 3+ VIEW COMPARISON:  No priors. FINDINGS: There is an oblique nondisplaced fracture of the distal fibular diaphysis, which may be very mildly comminuted. Distal  tibia appears intact. Ankle mortise appears preserved. Soft tissues are swollen surrounding the ankle, most evident overlying the lateral malleolus. Degenerative changes of osteoarthritis are noted in the visualized midfoot and hindfoot. IMPRESSION: 1. Oblique potentially minimally comminuted but nondisplaced fracture of the distal third of the fibular diaphysis. Electronically Signed   By: Trudie Reed M.D.   On: 08/10/2023 07:38   DG Foot Complete Left  Result Date: 08/10/2023 CLINICAL DATA:  62 year old female with history of twisting injury to the left ankle complaining of left foot pain. EXAM: LEFT FOOT - COMPLETE 3+ VIEW COMPARISON:  No priors. FINDINGS: There is a minimally displaced oblique fracture through the base of the first metatarsal which appears to extend to the articular surface. No other acute displaced fracture or dislocation is noted. Hallux valgus metatarsus primus varus. Multifocal joint space narrowing, subchondral sclerosis, subchondral cyst formation and osteophyte formation noted, most evident in the mid and hindfoot. IMPRESSION: 1. Minimally displaced intra-articular fracture through the proximal aspect of the first metatarsal. 2. Bunion deformity. 3. Degenerative changes of osteoarthritis, as above. Electronically Signed   By: Trudie Reed M.D.   On: 08/10/2023 07:37   DG Knee Complete 4 Views Left  Result Date: 08/10/2023 CLINICAL DATA:  62 year old female with history of left knee pain. EXAM: LEFT KNEE - COMPLETE 4+ VIEW COMPARISON:  No priors. FINDINGS: Four views of the left knee demonstrate no acute displaced fracture, subluxation or dislocation. There is some mild joint space narrowing and subchondral sclerosis indicative of mild osteoarthritis. IMPRESSION: 1. No acute radiographic abnormality of the left knee. 2. Mild osteoarthritis. Electronically Signed   By: Trudie Reed M.D.   On: 08/10/2023 07:34    Procedures Procedures    Medications Ordered in  ED Medications  HYDROcodone-acetaminophen (NORCO/VICODIN) 5-325 MG per tablet 1 tablet (1 tablet Oral Given 08/10/23 0737)    ED Course/ Medical Decision Making/ A&P                                 Medical Decision Making Patient reports she injured her foot and ankle yesterday.  Amount and/or Complexity of Data Reviewed Radiology: ordered and independent interpretation performed. Decision-making details documented in ED Course.    Details: X-ray left knee is negative.  Left foot shows a first metatarsal fracture.  X-ray left ankle shows left distal fibula fracture.  Risk Prescription drug management. Risk Details: Patient is counseled on results she is advised of 2 areas of fracture.  Patient is placed in a cam walker she is given crutches patient is given a prescription for hydrocodone.  She is advised to follow-up with Dr. Randye Lobo orthopedist on-call.  She should call his office  on Monday to schedule to be seen for evaluation.            Final Clinical Impression(s) / ED Diagnoses Final diagnoses:  Closed displaced fracture of first metatarsal bone of left foot, initial encounter  Other closed fracture of distal end of left fibula, initial encounter    Rx / DC Orders ED Discharge Orders          Ordered    HYDROcodone-acetaminophen (NORCO/VICODIN) 5-325 MG tablet  Every 4 hours PRN        08/10/23 0807           An After Visit Summary was printed and given to the patient.    Elson Areas, [place] 08/10/23 1006    Margarita Grizzle, MD 08/10/23 [phone]

## 2023-08-10 NOTE — ED Triage Notes (Addendum)
Pt BIB EMS from home. EMS reports pt Twisted L ankle yesterday morning. Also endorses L knee pain. A&Ox4.    Pt states "Im worried its a blood clot."  EMS VS 128/74 HR 84, 96% RA

## 2023-08-19 IMAGING — US US ABDOMEN COMPLETE W/ ELASTOGRAPHY
1 series · 12 of 25 positions shown · non-contrast
Comparison: None.

CLINICAL DATA: Hepatitis-C

EXAM:
ULTRASOUND ABDOMEN
ULTRASOUND HEPATIC ELASTOGRAPHY
TECHNIQUE: Sonography of the upper abdomen was performed. In addition,
ultrasound elastography evaluation of the liver was performed. A
region of interest was placed within the right lobe of the liver.
Following application of a compressive sonographic pulse, tissue
compressibility was assessed. Multiple assessments were performed at
the selected site. Median tissue compressibility was determined.
Previously, hepatic stiffness was assessed by shear wave velocity.
Based on recently published Society of Radiologists in Ultrasound
consensus article, reporting is now recommended to be performed in
the SI units of pressure (kiloPascals) representing hepatic
stiffness/elasticity. The obtained result is compared to the
published reference standards. (cACLD = compensated Advanced Chronic
Liver Disease)

[Series 1: us abdomen complete w/elastography · 12 of 124 slices shown]
[im 6/124]
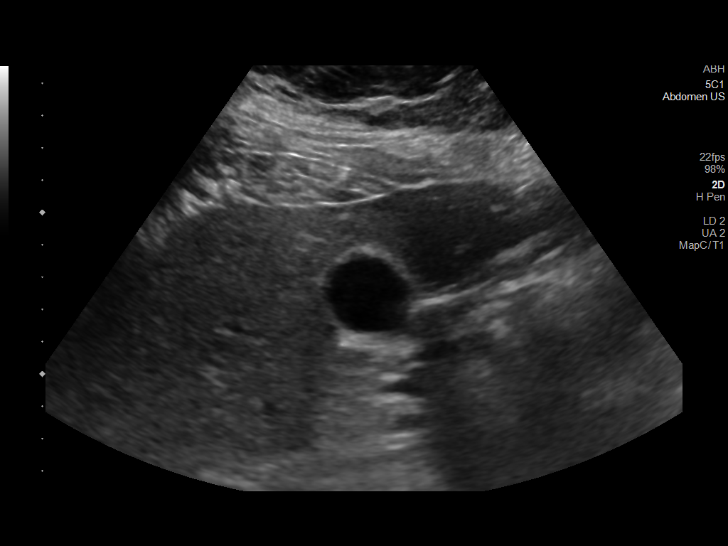
[im 16/124]
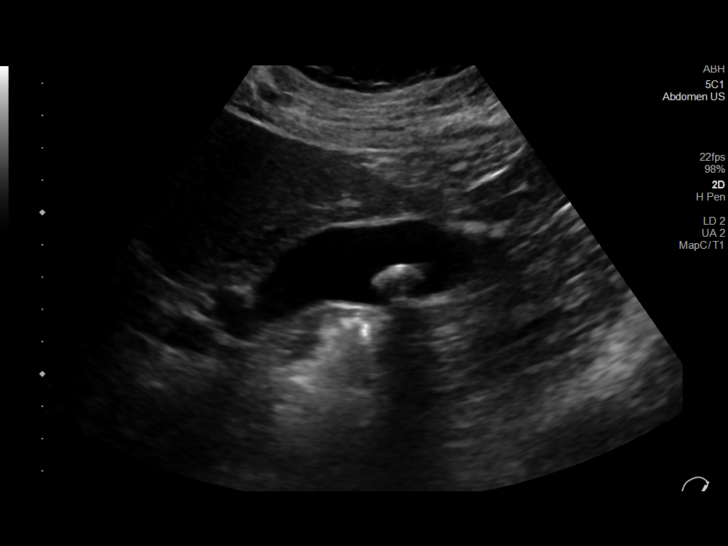
[im 26/124]
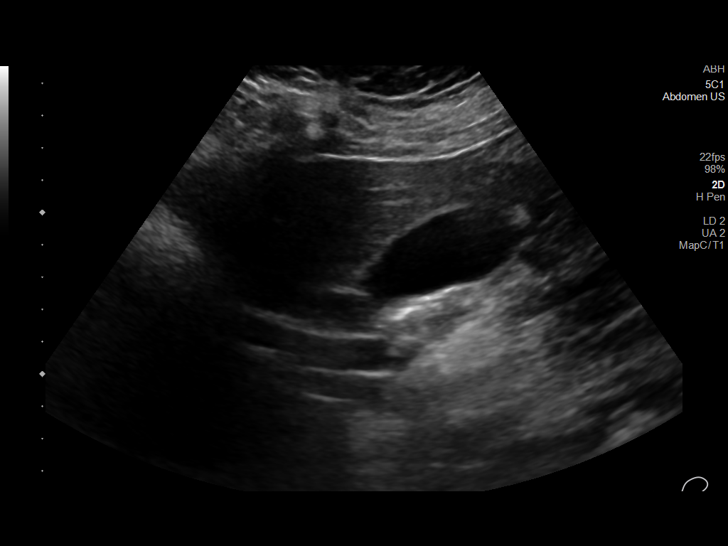
[im 36/124]
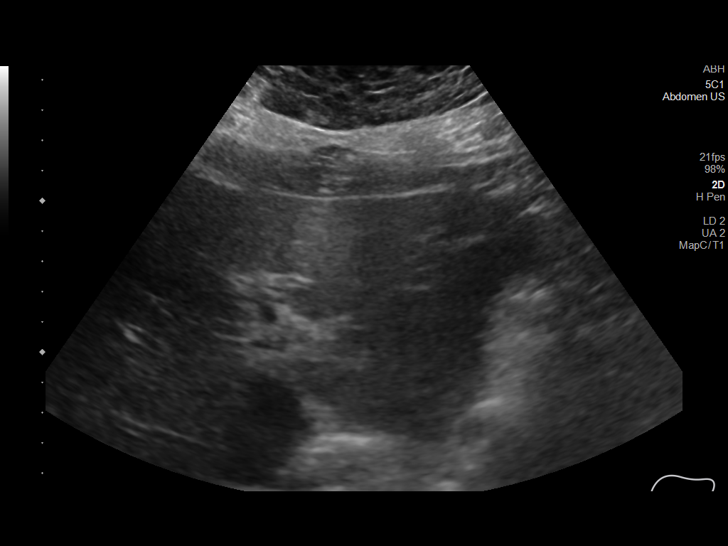
[im 47/124]
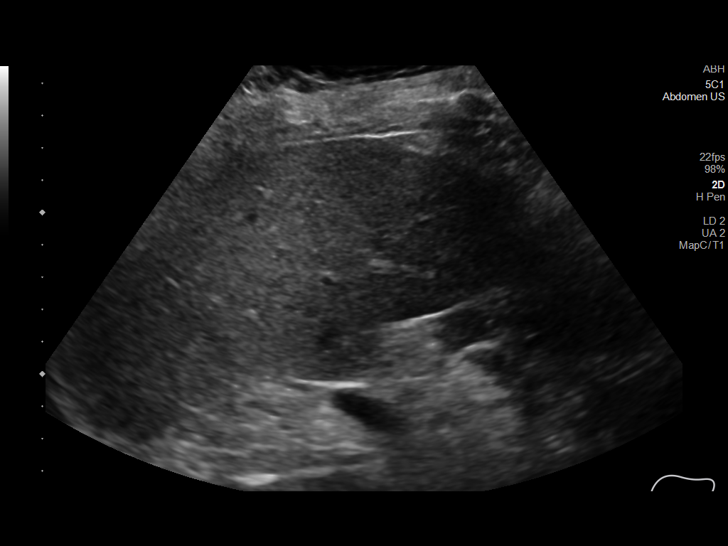
[im 57/124]
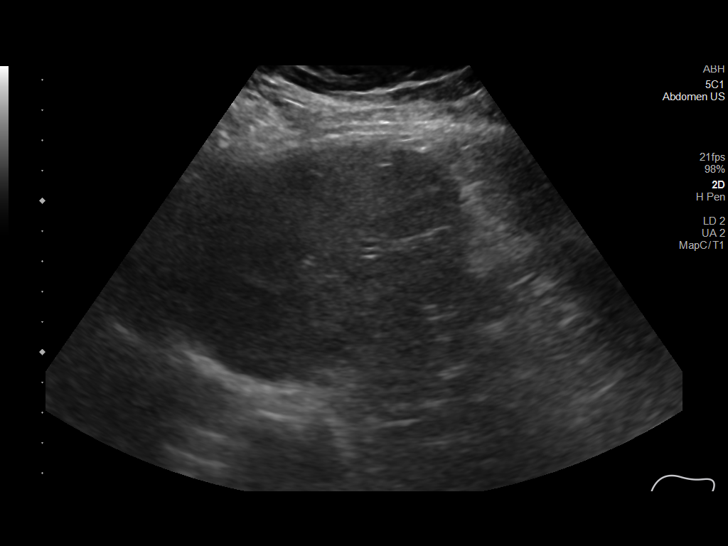
[im 67/124]
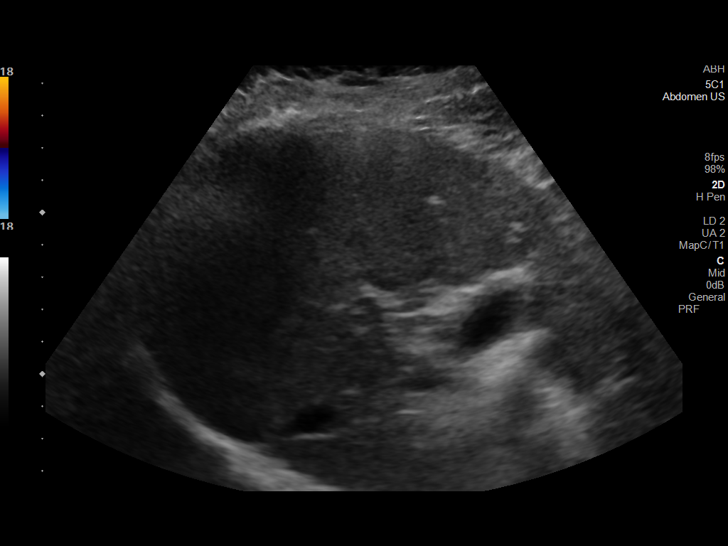
[im 77/124]
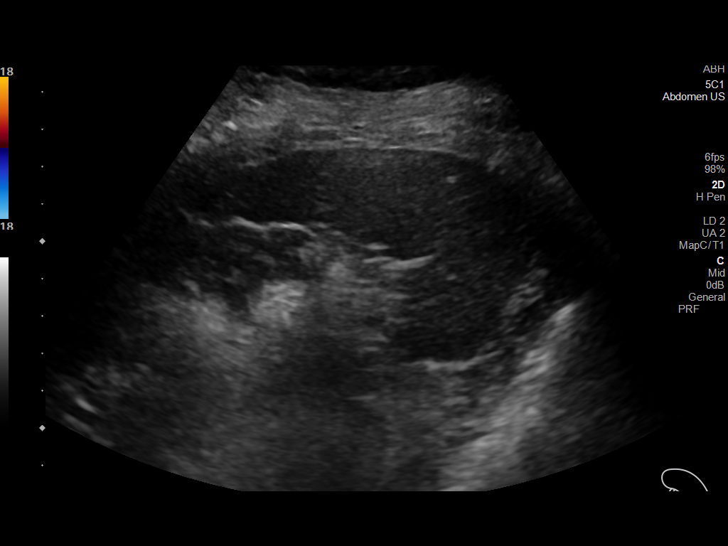
[im 88/124]
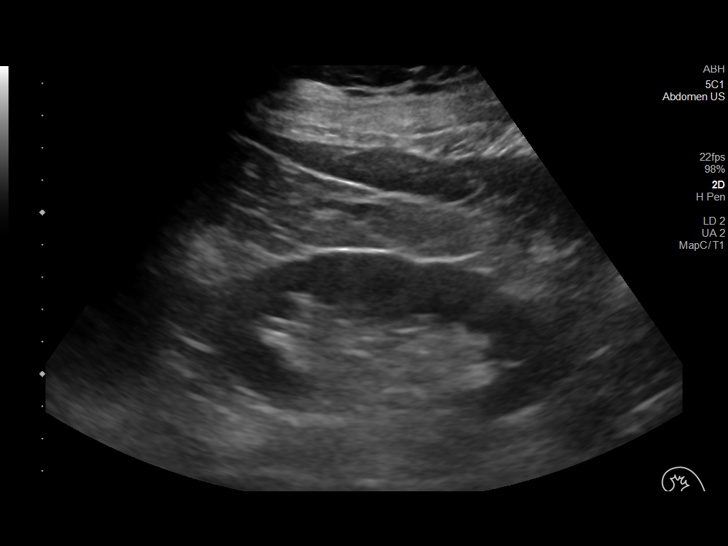
[im 98/124]
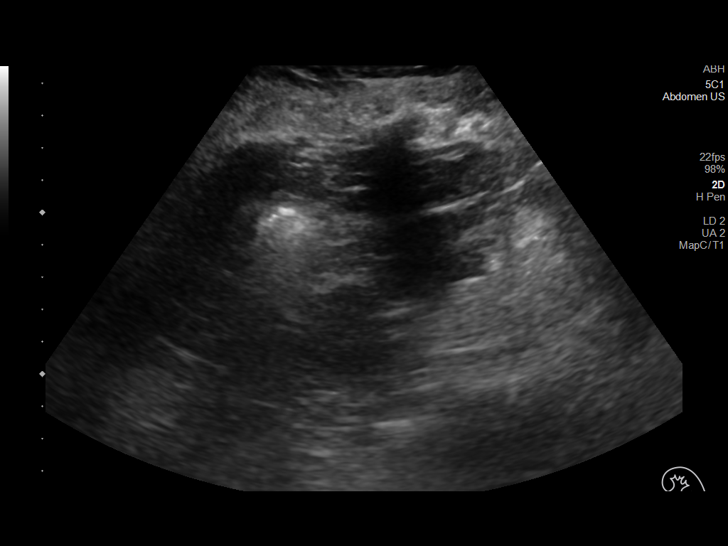
[im 108/124]
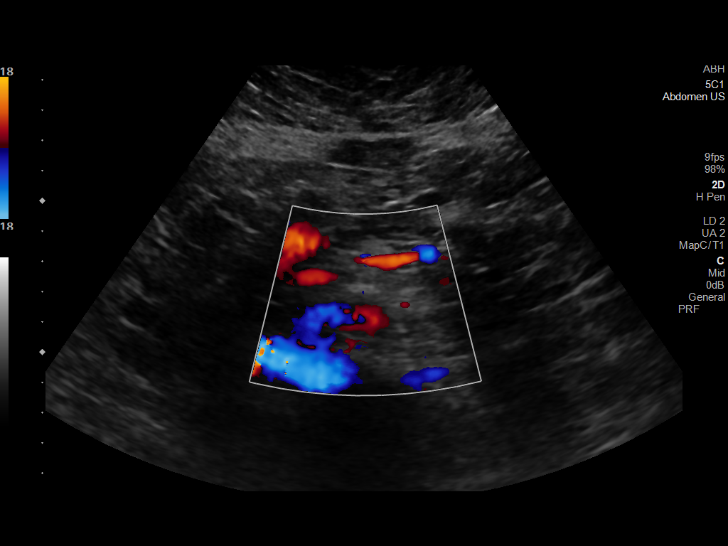
[im 118/124]
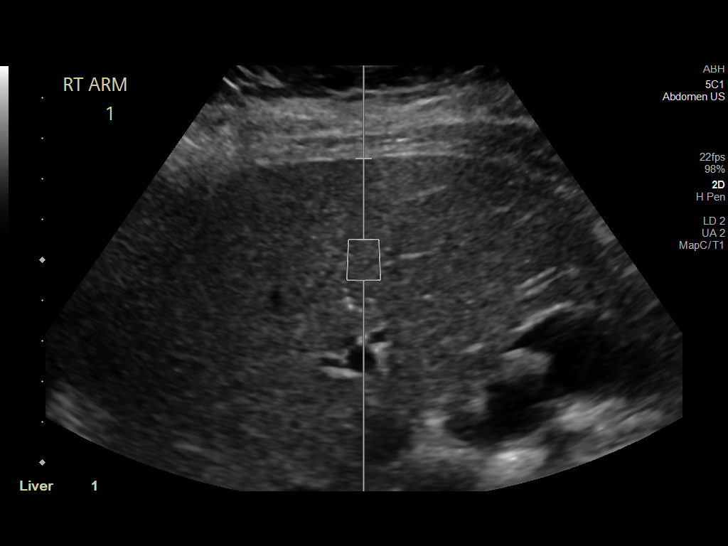

[12 of 25 positions shown; findings below may reference images not displayed]

FINDINGS: ULTRASOUND ABDOMEN

Gallbladder: 1.6 cm gallstone within the gallbladder. No wall
thickening or sonographic Murphy sign.

Common bile duct: Diameter: Mildly dilated, 8 mm. Distal duct cannot
be visualized due to overlying bowel gas.

Liver: No focal lesion identified. Within normal limits in
parenchymal echogenicity. Portal vein is patent on color Doppler
imaging with normal direction of blood flow towards the liver.

IVC: No abnormality visualized.

Pancreas: Not visualized due to overlying bowel gas.

Spleen: Size and appearance within normal limits.

Right Kidney: Length: 10.7 cm. Echogenicity within normal limits. No
mass or hydronephrosis visualized.

Left Kidney: Length: 11.3 cm. Echogenicity within normal limits. No
mass or hydronephrosis visualized.

Abdominal aorta: No aneurysm visualized.

Other findings: None.

ULTRASOUND HEPATIC ELASTOGRAPHY

Device: Siemens Helix VTQ

Patient position: Supine

Transducer [id number]

Number of measurements: 10

Hepatic segment:  8

Median kPa:

IQR:

IQR/Median kPa ratio:

Data quality:  Good

Diagnostic category:  < or = 5 kPa: high probability of being normal

The use of hepatic elastography is applicable to patients with viral
hepatitis and non-alcoholic fatty liver disease. At this time, there
is insufficient data for the referenced cut-off values and use in
other causes of liver disease, including alcoholic liver disease.
Patients, however, may be assessed by elastography and serve as
their own reference standard/baseline.

In patients with non-alcoholic liver disease, the values suggesting
compensated advanced chronic liver disease (cACLD) may be lower, and
patients may need additional testing with elasticity results of [DATE]
kPa.

Please note that abnormal hepatic elasticity and shear wave
velocities may also be identified in clinical settings other than
with hepatic fibrosis, such as: acute hepatitis, elevated right
heart and central venous pressures including use of beta blockers,
Agustin disease (Chelsie), infiltrative processes such as
mastocytosis/amyloidosis/infiltrative tumor/lymphoma, extrahepatic
cholestasis, with hyperemia in the post-prandial state, and with
liver transplantation. Correlation with patient history, laboratory
data, and clinical condition recommended.

Diagnostic Categories:

< or =5 kPa: high probability of being normal

< or =9 kPa: in the absence of other known clinical signs, rules [DATE] kPa and ?13 kPa: suggestive of cACLD, but needs further testing

>13 kPa: highly suggestive of cACLD

> or =17 kPa: highly suggestive of cACLD with an increased
probability of clinically significant portal hypertension
IMPRESSION: ULTRASOUND ABDOMEN:

Cholelithiasis.

Common bile duct dilated, 8 mm. Recommend correlation with LFTs.
This could be further evaluated with MRCP if felt clinically
indicated.

ULTRASOUND HEPATIC ELASTOGRAPHY:

Median kPa:

Diagnostic category:  < or = 5 kPa: high probability of being normal

## 2023-10-11 ENCOUNTER — Other Ambulatory Visit: Payer: Self-pay

## 2023-10-11 ENCOUNTER — Observation Stay (HOSPITAL_COMMUNITY)
Admission: EM | Admit: 2023-10-11 | Discharge: 2023-10-12 | Disposition: A | Discharge status: Home / Self Care | Payer: MEDICAID | Attending: Internal Medicine | Admitting: Internal Medicine

## 2023-10-11 ENCOUNTER — Emergency Department (HOSPITAL_COMMUNITY): Payer: MEDICAID

## 2023-10-11 DIAGNOSIS — F419 Anxiety disorder, unspecified: Secondary | ICD-10-CM | POA: Insufficient documentation

## 2023-10-11 DIAGNOSIS — G459 Transient cerebral ischemic attack, unspecified: Secondary | ICD-10-CM | POA: Diagnosis present

## 2023-10-11 DIAGNOSIS — F1120 Opioid dependence, uncomplicated: Secondary | ICD-10-CM | POA: Diagnosis not present

## 2023-10-11 DIAGNOSIS — I129 Hypertensive chronic kidney disease with stage 1 through stage 4 chronic kidney disease, or unspecified chronic kidney disease: Secondary | ICD-10-CM | POA: Diagnosis not present

## 2023-10-11 DIAGNOSIS — F17210 Nicotine dependence, cigarettes, uncomplicated: Secondary | ICD-10-CM | POA: Insufficient documentation

## 2023-10-11 DIAGNOSIS — B182 Chronic viral hepatitis C: Secondary | ICD-10-CM | POA: Diagnosis present

## 2023-10-11 DIAGNOSIS — I959 Hypotension, unspecified: Secondary | ICD-10-CM | POA: Diagnosis not present

## 2023-10-11 DIAGNOSIS — Z8673 Personal history of transient ischemic attack (TIA), and cerebral infarction without residual deficits: Secondary | ICD-10-CM | POA: Diagnosis not present

## 2023-10-11 DIAGNOSIS — M25462 Effusion, left knee: Secondary | ICD-10-CM | POA: Insufficient documentation

## 2023-10-11 DIAGNOSIS — F32A Depression, unspecified: Secondary | ICD-10-CM | POA: Diagnosis not present

## 2023-10-11 DIAGNOSIS — N1831 Chronic kidney disease, stage 3a: Secondary | ICD-10-CM | POA: Diagnosis not present

## 2023-10-11 DIAGNOSIS — G8929 Other chronic pain: Secondary | ICD-10-CM | POA: Insufficient documentation

## 2023-10-11 DIAGNOSIS — N179 Acute kidney failure, unspecified: Principal | ICD-10-CM | POA: Diagnosis present

## 2023-10-11 DIAGNOSIS — K7150 Toxic liver disease with chronic active hepatitis without ascites: Secondary | ICD-10-CM | POA: Insufficient documentation

## 2023-10-11 DIAGNOSIS — D631 Anemia in chronic kidney disease: Secondary | ICD-10-CM | POA: Diagnosis not present

## 2023-10-11 DIAGNOSIS — R55 Syncope and collapse: Secondary | ICD-10-CM | POA: Diagnosis present

## 2023-10-11 DIAGNOSIS — F418 Other specified anxiety disorders: Secondary | ICD-10-CM | POA: Insufficient documentation

## 2023-10-11 DIAGNOSIS — M25562 Pain in left knee: Secondary | ICD-10-CM | POA: Insufficient documentation

## 2023-10-11 DIAGNOSIS — Z8619 Personal history of other infectious and parasitic diseases: Secondary | ICD-10-CM | POA: Diagnosis not present

## 2023-10-11 DIAGNOSIS — I10 Essential (primary) hypertension: Secondary | ICD-10-CM | POA: Diagnosis present

## 2023-10-11 LAB — CBC WITH DIFFERENTIAL/PLATELET
Abs Immature Granulocytes: 0.07 10*3/uL (ref 0.00–0.07)
Basophils Absolute: 0.1 10*3/uL (ref 0.0–0.1)
Basophils Relative: 0 %
Eosinophils Absolute: 0 10*3/uL (ref 0.0–0.5)
Eosinophils Relative: 0 %
HCT: 34.6 % — ABNORMAL LOW (ref 36.0–46.0)
Hemoglobin: 11.3 g/dL — ABNORMAL LOW (ref 12.0–15.0)
Immature Granulocytes: 1 %
Lymphocytes Relative: 12 %
Lymphs Abs: 1.6 10*3/uL (ref 0.7–4.0)
MCH: 31.2 pg (ref 26.0–34.0)
MCHC: 32.7 g/dL (ref 30.0–36.0)
MCV: 95.6 fL (ref 80.0–100.0)
Monocytes Absolute: 1.2 10*3/uL — ABNORMAL HIGH (ref 0.1–1.0)
Monocytes Relative: 9 %
Neutro Abs: 11 10*3/uL — ABNORMAL HIGH (ref 1.7–7.7)
Neutrophils Relative %: 78 %
Platelets: 233 10*3/uL (ref 150–400)
RBC: 3.62 MIL/uL — ABNORMAL LOW (ref 3.87–5.11)
RDW: 13.8 % (ref 11.5–15.5)
WBC: 14 10*3/uL — ABNORMAL HIGH (ref 4.0–10.5)
nRBC: 0 % (ref 0.0–0.2)

## 2023-10-11 LAB — BASIC METABOLIC PANEL
Anion gap: 14 (ref 5–15)
BUN: 41 mg/dL — ABNORMAL HIGH (ref 8–23)
CO2: 22 mmol/L (ref 22–32)
Calcium: 9.7 mg/dL (ref 8.9–10.3)
Chloride: 99 mmol/L (ref 98–111)
Creatinine, Ser: 3.47 mg/dL — ABNORMAL HIGH (ref 0.44–1.00)
GFR, Estimated: 14 mL/min — ABNORMAL LOW (ref >60)
Glucose, Bld: 137 mg/dL — ABNORMAL HIGH (ref 70–99)
Potassium: 3.2 mmol/L — ABNORMAL LOW (ref 3.5–5.1)
Sodium: 135 mmol/L (ref 135–145)

## 2023-10-11 MED ORDER — LACTATED RINGERS IV BOLUS
2000.0000 mL | Freq: Once | INTRAVENOUS | Status: AC
  Administered 2023-10-12: 2000 mL via INTRAVENOUS

## 2023-10-11 MED ORDER — SODIUM CHLORIDE 0.9 % IV BOLUS
1000.0000 mL | Freq: Once | INTRAVENOUS | Status: AC
  Administered 2023-10-11: 1000 mL via INTRAVENOUS

## 2023-10-11 MED ORDER — OXYCODONE-ACETAMINOPHEN 5-325 MG PO TABS
1.0000 | ORAL_TABLET | Freq: Once | ORAL | Status: AC
  Administered 2023-10-11: 1 via ORAL
  Filled 2023-10-11: qty 1

## 2023-10-11 NOTE — ED Triage Notes (Signed)
Patient comes in via ems, syncopal episode at home. Fell on left knee, hit head, and does feel well. Took extra blood pressure medication without taking blood pressure because she thought she was hypertensive. Hypotensive at via ems - 80/50 Patinet had diarrhea this am as well.

## 2023-10-11 NOTE — ED Provider Notes (Signed)
[place] EMERGENCY DEPARTMENT AT [facility] Provider Note   CSN: [id number] Arrival date & time: 10/11/23  1752     History  Chief Complaint  Patient presents with   Loss of Consciousness   Knee Injury    Amanda Moody is a 63 y.o. female.  Patient with history of hypertension and stroke presents today with complaints of syncope.  She states that when she woke up this morning she noted that she had a little bit of a headache and she thought her blood pressure might be high and subsequently took another dose of her home blood pressure medication.  She did not check her blood pressure before taking the additional dose.  About an hour after taking the additional dose of her blood pressure medication she went to the bathroom to have a bowel movement and felt lightheaded and subsequently had a syncopal episode and fell from the toilet onto her left knee.  She endorses pain to same.  She was unable to get up and walk due to pain and therefore EMS was called.  Upon EMS arrival, patient was reportedly hypotensive in the 80s and was transported for evaluation.  Patient denies any other injuries or complaints.  She is not anticoagulated.  Additionally, of note patient states that she was being followed by nephrology for kidney disease, however in the last few months she stopped urinating quite so much and was concerned that her kidney function might be worsening and was therefore scared to follow-up and have her levels rechecked and was subsequently lost to follow-up.  She denies any abdominal or flank pain.  The history is provided by the patient. No language interpreter was used.  Loss of Consciousness      Home Medications Prior to Admission medications   Medication Sig Start Date End Date Taking? Authorizing Provider  diazepam (VALIUM) 5 MG tablet Take 5 mg by mouth 2 (two) times daily as needed. 09/16/21   [provider]  Diclofenac Sodium 1 % CREA Apply 1 Application  topically in the morning and at bedtime. 08/08/22   Gerhard Munch, MD  FLUoxetine (PROZAC) 40 MG capsule Take 40 mg by mouth daily. 09/16/21   [provider]  gabapentin (NEURONTIN) 300 MG capsule Take 300 mg by mouth 3 (three) times daily. 09/20/21   [provider]  Glecaprevir-Pibrentasvir (MAVYRET) 100-40 MG TABS Take 3 tablets by mouth daily with breakfast. 09/28/21   Jennette Kettle, RPH-CPP  HYDROcodone-acetaminophen (NORCO/VICODIN) 5-325 MG tablet Take 2 tablets by mouth every 4 (four) hours as needed. 08/10/23   Elson Areas, PA-C  HYDROcodone-acetaminophen (NORCO/VICODIN) 5-325 MG tablet Take 1 tablet by mouth every 4 (four) hours as needed for moderate pain (pain score 4-6). 08/10/23 08/09/24  Elson Areas, PA-C  lisinopril-hydrochlorothiazide (ZESTORETIC) 10-12.5 MG tablet Take 1 tablet by mouth daily. 11/07/15   Meredeth Ide, MD  methadone (DOLOPHINE) 10 MG tablet Take 140 mg by mouth daily.    [provider]  omeprazole (PRILOSEC) 40 MG capsule Take 40 mg by mouth daily. 10/02/15   [provider]  pantoprazole (PROTONIX) 40 MG tablet Take 40 mg by mouth daily. 04/21/21   [provider]  predniSONE (DELTASONE) 20 MG tablet Take 2 tablets (40 mg total) by mouth daily with breakfast. For the next four days 08/08/22   Gerhard Munch, MD      Allergies    Marylen Ponto hcl]    Review of Systems   Review of Systems  Cardiovascular:  Positive for syncope.  Musculoskeletal:  Positive for myalgias.  All other systems reviewed and are negative.   Physical Exam Updated Vital Signs BP 131/72   Pulse 61   Temp 98.9 F (37.2 C) (Oral)   Resp 19   Ht 5\' 7"  (1.702 m)   Wt 76.2 kg   SpO2 96%   BMI 26.31 kg/m  Physical Exam Vitals and nursing note reviewed.  Constitutional:      General: She is not in acute distress.    Appearance: Normal appearance. She is normal weight. She is not ill-appearing, toxic-appearing or  diaphoretic.  HENT:     Head: Normocephalic and atraumatic.  Cardiovascular:     Rate and Rhythm: Normal rate and regular rhythm.     Heart sounds: Normal heart sounds.  Pulmonary:     Effort: Pulmonary effort is normal. No respiratory distress.     Breath sounds: Normal breath sounds.  Abdominal:     General: Abdomen is flat.     Palpations: Abdomen is soft.     Tenderness: There is no abdominal tenderness.  Musculoskeletal:        General: Normal range of motion.     Cervical back: Normal range of motion.     Comments: TTP noted to palpation of the left medial knee without obvious deformity or overlying skin changes.  DP and PT pulses intact and 2+.  Skin:    General: Skin is warm and dry.  Neurological:     General: No focal deficit present.     Mental Status: She is alert and oriented to person, place, and time.  Psychiatric:        Mood and Affect: Mood normal.        Behavior: Behavior normal.     ED Results / Procedures / Treatments   Labs (all labs ordered are listed, but only abnormal results are displayed) Labs Reviewed  BASIC METABOLIC PANEL - Abnormal; Notable for the following components:      Result Value   Potassium 3.2 (*)    Glucose, Bld 137 (*)    BUN 41 (*)    Creatinine, Ser 3.47 (*)    GFR, Estimated 14 (*)    All other components within normal limits  CBC WITH DIFFERENTIAL/PLATELET - Abnormal; Notable for the following components:   WBC 14.0 (*)    RBC 3.62 (*)    Hemoglobin 11.3 (*)    HCT 34.6 (*)    Neutro Abs 11.0 (*)    Monocytes Absolute 1.2 (*)    All other components within normal limits    EKG None  Radiology DG Knee Complete 4 Views Left Result Date: 10/11/2023 CLINICAL DATA:  Fall EXAM: LEFT KNEE - COMPLETE 4+ VIEW COMPARISON:  08/10/2023 FINDINGS: Small suprapatellar effusion. No fracture or dislocation. Mild patellofemoral osteoarthrosis. IMPRESSION: Small suprapatellar effusion without fracture or dislocation. Electronically  Signed   By: Deatra Robinson M.D.   On: 10/11/2023 19:44    Procedures .Critical Care  Performed by: Silva Bandy, PA-C Authorized by: Silva Bandy, PA-C   Critical care provider statement:    Critical care time (minutes):  35   Critical care was necessary to treat or prevent imminent or life-threatening deterioration of the following conditions:  Dehydration and renal failure   Critical care was time spent personally by me on the following activities:  Development of treatment plan with patient or surrogate, discussions with primary provider, evaluation of patient's response to treatment,  examination of patient, obtaining history from patient or surrogate, ordering and review of laboratory studies, ordering and review of radiographic studies, pulse oximetry, re-evaluation of patient's condition and review of old charts   Care discussed with: admitting provider       Medications Ordered in ED Medications  sodium chloride 0.9 % bolus 1,000 mL (has no administration in time range)  oxyCODONE-acetaminophen (PERCOCET/ROXICET) 5-325 MG per tablet 1 tablet (1 tablet Oral Given 10/11/23 2000)    ED Course/ Medical Decision Making/ A&P                                 Medical Decision Making Amount and/or Complexity of Data Reviewed Labs: ordered. Radiology: ordered.  Risk Prescription drug management.   This patient is a 63 y.o. female who presents to the ED for concern of syncope, knee pain, this involves an extensive number of treatment options, and is a complaint that carries with it a high risk of complications and morbidity. The emergent differential diagnosis prior to evaluation includes, but is not limited to,  CVA, ACS, arrhythmia, vasovagal syncope, orthostatic hypotension, sepsis, hypoglycemia, electrolyte disturbance, respiratory failure, symptomatic anemia, dehydration, heat injury, polypharmacy, malignancy, anxiety/panic attack.    This is not an exhaustive differential.    Past Medical History / Co-morbidities / Social History:  has a past medical history of Anxiety, Hypertension, and Stroke (HCC).  Additional history: Chart reviewed.  Physical Exam: Physical exam performed. The pertinent findings include: TTP noted to the left knee without obvious deformity.  Good distal pulses and sensation.  Otherwise well-appearing, abdomen soft and nontender.  Lab Tests: I ordered, and personally interpreted labs.  The pertinent results include:  WBC 14.0, hgb 11.3 consistent with previous. K 3.2, BUN 41, creatinine 3.47 (up from 15 and 1.16 in November 2023)   Imaging Studies: I ordered imaging studies including DG left knee. I independently visualized and interpreted imaging which showed   Small suprapatellar effusion without fracture or dislocation.   I agree with the radiologist interpretation.   Cardiac Monitoring:  The patient was maintained on a cardiac monitor.  My attending physician Dr. Suezanne Jacquet viewed and interpreted the cardiac monitored which showed an underlying rhythm of: Qtc prolongation, sinus rhythm. I agree with this interpretation.   Medications: I ordered medication including percocet for pain, fluids  for AKI. Reevaluation of the patient after these medicines showed that the patient improved. I have reviewed the patients home medicines and have made adjustments as needed.   Disposition: After consideration of the diagnostic results and the patients response to treatment, I feel that patient will require admission for AKI, likely related to dehydration. Likely has been worsening over the past some time as patient states she has been making less urine recently. Patient is understanding and in agreement with admission plan  Discussed patient with hospitalist Dr. Toniann Fail who accepts patient for admission.  I discussed this case with my attending physician Dr. Suezanne Jacquet who cosigned this note including patient's presenting symptoms, physical  exam, and planned diagnostics and interventions. Attending physician stated agreement with plan or made changes to plan which were implemented.    Final Clinical Impression(s) / ED Diagnoses Final diagnoses:  AKI (acute kidney injury) (HCC)  Vasovagal syncope  Acute pain of left knee    Rx / DC Orders ED Discharge Orders     None         Candida Vetter, Shawn Route, PA-C  10/11/23 2315    Lonell Grandchild, MD 10/11/23 2321

## 2023-10-11 NOTE — ED Notes (Signed)
RN made aware of pt BP 

## 2023-10-12 ENCOUNTER — Encounter (HOSPITAL_COMMUNITY): Payer: Self-pay | Admitting: Internal Medicine

## 2023-10-12 ENCOUNTER — Observation Stay (HOSPITAL_COMMUNITY): Payer: MEDICAID

## 2023-10-12 ENCOUNTER — Other Ambulatory Visit (HOSPITAL_COMMUNITY): Payer: MEDICAID

## 2023-10-12 DIAGNOSIS — R55 Syncope and collapse: Secondary | ICD-10-CM | POA: Insufficient documentation

## 2023-10-12 DIAGNOSIS — M25462 Effusion, left knee: Secondary | ICD-10-CM | POA: Insufficient documentation

## 2023-10-12 DIAGNOSIS — N179 Acute kidney failure, unspecified: Secondary | ICD-10-CM | POA: Diagnosis not present

## 2023-10-12 DIAGNOSIS — I959 Hypotension, unspecified: Secondary | ICD-10-CM | POA: Insufficient documentation

## 2023-10-12 DIAGNOSIS — F418 Other specified anxiety disorders: Secondary | ICD-10-CM | POA: Insufficient documentation

## 2023-10-12 LAB — COMPREHENSIVE METABOLIC PANEL
ALT: 16 U/L (ref 0–44)
AST: 18 U/L (ref 15–41)
Albumin: 3.7 g/dL (ref 3.5–5.0)
Alkaline Phosphatase: 53 U/L (ref 38–126)
Anion gap: 10 (ref 5–15)
BUN: 35 mg/dL — ABNORMAL HIGH (ref 8–23)
CO2: 24 mmol/L (ref 22–32)
Calcium: 9.7 mg/dL (ref 8.9–10.3)
Chloride: 102 mmol/L (ref 98–111)
Creatinine, Ser: 2.46 mg/dL — ABNORMAL HIGH (ref 0.44–1.00)
GFR, Estimated: 21 mL/min — ABNORMAL LOW (ref >60)
Glucose, Bld: 112 mg/dL — ABNORMAL HIGH (ref 70–99)
Potassium: 3.3 mmol/L — ABNORMAL LOW (ref 3.5–5.1)
Sodium: 136 mmol/L (ref 135–145)
Total Bilirubin: 0.6 mg/dL (ref 0.0–1.2)
Total Protein: 7.1 g/dL (ref 6.5–8.1)

## 2023-10-12 LAB — IRON AND TIBC
Iron: 24 ug/dL — ABNORMAL LOW (ref 28–170)
Saturation Ratios: 6 % — ABNORMAL LOW (ref 10.4–31.8)
TIBC: 398 ug/dL (ref 250–450)
UIBC: 374 ug/dL

## 2023-10-12 LAB — CBC WITH DIFFERENTIAL/PLATELET
Abs Immature Granulocytes: 0.07 10*3/uL (ref 0.00–0.07)
Basophils Absolute: 0.1 10*3/uL (ref 0.0–0.1)
Basophils Relative: 0 %
Eosinophils Absolute: 0 10*3/uL (ref 0.0–0.5)
Eosinophils Relative: 0 %
HCT: 32.3 % — ABNORMAL LOW (ref 36.0–46.0)
Hemoglobin: 10.7 g/dL — ABNORMAL LOW (ref 12.0–15.0)
Immature Granulocytes: 1 %
Lymphocytes Relative: 21 %
Lymphs Abs: 2.4 10*3/uL (ref 0.7–4.0)
MCH: 31.8 pg (ref 26.0–34.0)
MCHC: 33.1 g/dL (ref 30.0–36.0)
MCV: 95.8 fL (ref 80.0–100.0)
Monocytes Absolute: 1.2 10*3/uL — ABNORMAL HIGH (ref 0.1–1.0)
Monocytes Relative: 11 %
Neutro Abs: 7.6 10*3/uL (ref 1.7–7.7)
Neutrophils Relative %: 67 %
Platelets: 221 10*3/uL (ref 150–400)
RBC: 3.37 MIL/uL — ABNORMAL LOW (ref 3.87–5.11)
RDW: 13.9 % (ref 11.5–15.5)
WBC: 11.4 10*3/uL — ABNORMAL HIGH (ref 4.0–10.5)
nRBC: 0 % (ref 0.0–0.2)

## 2023-10-12 LAB — RETICULOCYTES
Immature Retic Fract: 11.3 % (ref 2.3–15.9)
RBC.: 3.39 MIL/uL — ABNORMAL LOW (ref 3.87–5.11)
Retic Count, Absolute: 66.8 10*3/uL (ref 19.0–186.0)
Retic Ct Pct: 2 % (ref 0.4–3.1)

## 2023-10-12 LAB — FERRITIN: Ferritin: 92 ng/mL (ref 11–307)

## 2023-10-12 LAB — MAGNESIUM: Magnesium: 2.3 mg/dL (ref 1.7–2.4)

## 2023-10-12 LAB — VITAMIN B12: Vitamin B-12: 545 pg/mL (ref 180–914)

## 2023-10-12 LAB — HIV ANTIBODY (ROUTINE TESTING W REFLEX): HIV Screen 4th Generation wRfx: NONREACTIVE

## 2023-10-12 LAB — FOLATE: Folate: 12.2 ng/mL (ref >5.9)

## 2023-10-12 MED ORDER — FLUOXETINE HCL 20 MG PO CAPS
40.0000 mg | ORAL_CAPSULE | Freq: Every day | ORAL | Status: DC
Start: 2023-10-12 — End: 2023-10-12

## 2023-10-12 MED ORDER — ACETAMINOPHEN 650 MG RE SUPP: 650.0000 mg | Freq: Four times a day (QID) | RECTAL | Status: DC | PRN

## 2023-10-12 MED ORDER — CLONAZEPAM 0.5 MG PO TABS
0.5000 mg | ORAL_TABLET | Freq: Two times a day (BID) | ORAL | Status: DC | PRN
  Administered 2023-10-12: 0.5 mg via ORAL
  Filled 2023-10-12: qty 1

## 2023-10-12 MED ORDER — ACETAMINOPHEN 325 MG PO TABS: 650.0000 mg | ORAL_TABLET | Freq: Four times a day (QID) | ORAL | Status: DC | PRN

## 2023-10-12 MED ORDER — METHADONE HCL 10 MG PO TABS: 140.0000 mg | ORAL_TABLET | Freq: Every day | ORAL | Status: DC

## 2023-10-12 MED ORDER — ENOXAPARIN SODIUM 40 MG/0.4ML IJ SOSY
40.0000 mg | PREFILLED_SYRINGE | INTRAMUSCULAR | Status: DC
Start: 2023-10-12 — End: 2023-10-12

## 2023-10-12 MED ORDER — POTASSIUM CHLORIDE 20 MEQ PO PACK
20.0000 meq | PACK | Freq: Once | ORAL | Status: DC
  Filled 2023-10-12: qty 1

## 2023-10-12 MED ORDER — AMLODIPINE BESYLATE 5 MG PO TABS: 5.0000 mg | ORAL_TABLET | Freq: Every day | ORAL | 0 refills | Status: AC

## 2023-10-12 MED ORDER — LACTATED RINGERS IV SOLN: INTRAVENOUS | Status: DC

## 2023-10-12 NOTE — H&P (Signed)
History and Physical    Amanda Moody WUJ:[id number] DOB: 1961-04-12 DOA: 10/11/2023  Patient coming from: Home.  Chief Complaint: Loss of consciousness.  HPI: Amanda Moody is a 63 y.o. female with history of hypertension, chronic and disease stage III, methadone use, prior TIA, hepatitis C treated was brought to the ER after patient had a syncopal episode and complaining of left knee pain.  Patient states yesterday morning when she woke up she had some frontal headache and thought that she may be having elevated blood pressure so she took additional dose of her lisinopril hydrochlorothiazide without checking her blood pressure.  Following which she walked to the bathroom and while sitting on the commode she felt dizzy and she passed out and landed on her knee.  After which she was not able to walk because of the pain she called EMS.  EMS on arrival found that her blood pressure systolic was in the 80s was given fluid bolus and brought to the ER.  ED Course: In the ER Patient was hypotensive was given further additional fluid boluses following which blood pressure improved.  EKG shows normal sinus rhythm with prolonged QTc of 584 ms.  Labs show creatinine of 3.4 which is increased from 1.16 from November 2023.  Patient is being admitted for acute on chronic kidney disease stage III with syncopal episode.  Review of Systems: As per HPI, rest all negative.   Past Medical History:  Diagnosis Date   Anxiety    Hypertension    Stroke [facility])     Past Surgical History:  Procedure Laterality Date   FRACTURE SURGERY     TUBAL LIGATION       reports that she has been smoking cigarettes. She has never used smokeless tobacco. She reports that she does not drink alcohol and does not use drugs.  Allergies  Allergen Reactions   Flexeril [Cyclobenzaprine Hcl] Other (See Comments)    Out of mind    History reviewed. No pertinent family history.  Prior to Admission medications   Medication Sig Start  Date End Date Taking? Authorizing Provider  clonazePAM (KLONOPIN) 0.5 MG tablet Take 0.5 mg by mouth 2 (two) times daily as needed. 10/10/23  Yes [provider]  FLUoxetine (PROZAC) 40 MG capsule Take 40 mg by mouth daily. 09/16/21  Yes [provider]  lisinopril-hydrochlorothiazide (ZESTORETIC) 20-25 MG tablet Take 1 tablet by mouth daily.   Yes [provider]  LORazepam (ATIVAN) 1 MG tablet Take 0.5-1 mg by mouth 2 (two) times daily as needed. 08/10/23  Yes [provider]  methadone (DOLOPHINE) 10 MG tablet Take 140 mg by mouth daily.   Yes [provider]    Physical Exam: Constitutional: Moderately built and nourished. Vitals:   10/11/23 2331 10/12/23 0235 10/12/23 0300 10/12/23 0400  BP: (!) 101/52 109/70 (!) 103/57 106/60  Pulse: 68 68 70 67  Resp: 19 19    Temp: 98.2 F (36.8 C)     TempSrc: Oral     SpO2: 93% 95%  96%  Weight:      Height:       Eyes: Anicteric no pallor. ENMT: No discharge from the ears eyes nose or mouth. Neck: No mass felt.  No neck rigidity. Respiratory: No rhonchi or crepitations. Cardiovascular: S1-S2 heard. Abdomen: Soft nontender bowel sounds present. Musculoskeletal: Mild tenderness in the left knee. Skin: No rash. Neurologic: Alert awake oriented to time place and person.  Moves all extremities. Psychiatric appears normal.  Normal affect.  Labs on Admission: I have personally reviewed following labs and imaging studies  CBC: Recent Labs  Lab 10/11/23 1951  WBC 14.0*  NEUTROABS 11.0*  HGB 11.3*  HCT 34.6*  MCV 95.6  PLT 233   Basic Metabolic Panel: Recent Labs  Lab 10/11/23 1951  NA 135  K 3.2*  CL 99  CO2 22  GLUCOSE 137*  BUN 41*  CREATININE 3.47*  CALCIUM 9.7   GFR: Estimated Creatinine Clearance: 17.4 mL/min (A) (by C-G formula based on SCr of 3.47 mg/dL (H)). Liver Function Tests: No results for input(s): "AST", "ALT", "ALKPHOS", "BILITOT", "PROT", "ALBUMIN" in the last  168 hours. No results for input(s): "LIPASE", "AMYLASE" in the last 168 hours. No results for input(s): "AMMONIA" in the last 168 hours. Coagulation Profile: No results for input(s): "INR", "PROTIME" in the last 168 hours. Cardiac Enzymes: No results for input(s): "CKTOTAL", "CKMB", "CKMBINDEX", "TROPONINI" in the last 168 hours. BNP (last 3 results) No results for input(s): "PROBNP" in the last 8760 hours. HbA1C: No results for input(s): "HGBA1C" in the last 72 hours. CBG: No results for input(s): "GLUCAP" in the last 168 hours. Lipid Profile: No results for input(s): "CHOL", "HDL", "LDLCALC", "TRIG", "CHOLHDL", "LDLDIRECT" in the last 72 hours. Thyroid Function Tests: No results for input(s): "TSH", "T4TOTAL", "FREET4", "T3FREE", "THYROIDAB" in the last 72 hours. Anemia Panel: No results for input(s): "VITAMINB12", "FOLATE", "FERRITIN", "TIBC", "IRON", "RETICCTPCT" in the last 72 hours. Urine analysis:    Component Value Date/Time   COLORURINE YELLOW 08/14/2018 0713   APPEARANCEUR HAZY (A) 08/14/2018 0713   LABSPEC 1.008 08/14/2018 0713   PHURINE 6.0 08/14/2018 0713   GLUCOSEU NEGATIVE 08/14/2018 0713   HGBUR SMALL (A) 08/14/2018 0713   BILIRUBINUR NEGATIVE 08/14/2018 0713   KETONESUR NEGATIVE 08/14/2018 0713   PROTEINUR NEGATIVE 08/14/2018 0713   UROBILINOGEN 0.2 09/28/2007 1102   NITRITE NEGATIVE 08/14/2018 0713   LEUKOCYTESUR LARGE (A) 08/14/2018 0713   Sepsis Labs: @LABRCNTIP (procalcitonin:4,lacticidven:4) )No results found for this or any previous visit (from the past 240 hours).   Radiological Exams on Admission: DG Knee Complete 4 Views Left Result Date: 10/11/2023 CLINICAL DATA:  Fall EXAM: LEFT KNEE - COMPLETE 4+ VIEW COMPARISON:  08/10/2023 FINDINGS: Small suprapatellar effusion. No fracture or dislocation. Mild patellofemoral osteoarthrosis. IMPRESSION: Small suprapatellar effusion without fracture or dislocation. Electronically Signed   By: Deatra Robinson M.D.    On: 10/11/2023 19:44    EKG: Independently reviewed.  Sinus rhythm with prolonged QTc of 484 ms.  Assessment/Plan Principal Problem:   ARF (acute renal failure) (HCC) Active Problems:   Hypertension   Methadone dependence (HCC)   Chronic hepatitis C without hepatic coma (HCC)   Hypotension    Acute renal failure on chronic kidney disease stage III baseline creatinine is around 1.16 on November 2023.  Presently it is 3.4.  Could be related to hypotension and patient taking extra dose of lisinopril and hydrochlorothiazide.  Will hold lisinopril hydrochlorothiazide continue with gentle hydration, patient did receive fluid bolus in the ER.  If creatinine does not improve may consider further imaging.  Check UA. Syncope with EKG showing prolonged QTc.  Replace potassium check magnesium check 2D echo CT head is pending.  Suspect syncope likely from hypotension. Hypotension likely from patient taking extra dose of antihypertensives.  Antihypertensives lisinopril hydrochlorothiazide on hold.  Gently hydrate. Chronic anemia check anemia panel.  Follow CBC. Anxiety and depression on fluoxetine and as needed Klonopin. Methadone use. Prolonged QTc replace potassium recheck metabolic panel and magnesium  levels. History of treated hepatitis C and prior history of TIA. Left knee effusion after fall.  CT knee is pending.  Since patient has syncopal episode with acute renal failure will need close monitoring and more than 2 midnight stay.   DVT prophylaxis: Lovenox. Code Status: Full code. Family Communication: Discussed with patient. Disposition Plan: Monitored bed. Consults called: None. Admission status: Observation.

## 2023-10-12 NOTE — ED Notes (Signed)
Pt has called ride leaving AMA; AAOx4. Provider notified.

## 2023-10-12 NOTE — Discharge Summary (Signed)
Physician AGAINST MEDICAL ADVICE discharge Summary  Amanda Moody BJY:[id number] DOB: 03-27-1961 DOA: 10/11/2023  PCP: Vivien Presto, MD  Admit date: 10/11/2023 Discharge date: 10/12/2023  Admitted From: home Disposition:  left AMA  Recommendations for Outpatient Follow-up:  Follow up with PCP ASAP Follow-up with orthopedic surgery ASAP  Home Health: none Equipment/Devices: none  Discharge Condition: guarded CODE STATUS: Full code Diet Orders (From admission, onward)     Start     Ordered   10/12/23 0447  Diet Heart Room service appropriate? Yes; Fluid consistency: Thin  Diet effective now       Question Answer Comment  Room service appropriate? Yes   Fluid consistency: Thin      10/12/23 0448            HPI: Per admitting MD, Amanda Moody is a 63 y.o. female with history of hypertension, chronic and disease stage III, methadone use, prior TIA, hepatitis C treated was brought to the ER after patient had a syncopal episode and complaining of left knee pain.  Patient states yesterday morning when she woke up she had some frontal headache and thought that she may be having elevated blood pressure so she took additional dose of her lisinopril hydrochlorothiazide without checking her blood pressure.  Following which she walked to the bathroom and while sitting on the commode she felt dizzy and she passed out and landed on her knee.  After which she was not able to walk because of the pain she called EMS.  EMS on arrival found that her blood pressure systolic was in the 80s was given fluid bolus and brought to the ER.   Hospital Course / Discharge diagnoses: Principal Problem:   ARF (acute renal failure) (HCC) Active Problems:   TIA (transient ischemic attack)   Hypertension   Methadone dependence (HCC)   Chronic hepatitis C without hepatic coma (HCC)   Hypotension   Depression with anxiety   Effusion of left knee   Syncope  Patient at this time expresses desire to leave the  Hospital immidiately, patient has been warned that this is not Medically advisable at this time, and can result in Medical complications like Death and Disability, patient understands and accepts the risks involved and assumes full responsibilty of this decision.  This includes worsening of her renal failure, uncontrolled blood pressure with hypo vs hypertension and worsening of her knee injury  Principal problem Acute kidney injury on CKD stage IIIa-patient's baseline creatinine was around 1.16 in November 2023, she was admitted to the hospital with a creatinine of 3.4 in the setting of hypotension as well as taking double doses of lisinopril/HCTZ.  She has received IV fluids, blood pressure is improved, and her creatinine is now improving and it was 2.4 after fluids.  Active problems Left knee injury-upon falling she hurt her left knee, became swollen and has difficulties ambulating.  A CT scan was concerning for an avulsion injury, full read below.  Case was discussed with Karenann Cai, PA, with orthopedic surgery, and an MRI was recommended to better determine level of injury.  However patient refused further care, and decided to leave AMA Syncope-likely due to hypotension Essential hypertension-due to AKI she was recommended to discontinue lisinopril/HCTZ, recommend to be changed to amlodipine with close outpatient monitoring Anemia of chronic kidney disease-no bleeding Anxiety, depression-continue home medications Chronic pain-on methadone History of treated hep C -noted  Sepsis ruled out   Discharge Instructions   Consultations: Orthopedic surgery  Procedures/Studies:  CT  KNEE LEFT WO CONTRAST Result Date: 10/12/2023 CLINICAL DATA:  Knee pain, stress fracture suspected, neg xray EXAM: CT OF THE LEFT KNEE WITHOUT CONTRAST TECHNIQUE: Multidetector CT imaging of the left knee was performed according to the standard protocol. Multiplanar CT image reconstructions were also generated.  RADIATION DOSE REDUCTION: This exam was performed according to the departmental dose-optimization program which includes automated exposure control, adjustment of the mA and/or kV according to patient size and/or use of iterative reconstruction technique. COMPARISON:  X-ray 10/11/2023 FINDINGS: Bones/Joint/Cartilage Large knee joint effusion measuring upper limits of simple fluid density. No layering fluid-fluid or fat-fluid level. 4 mm linear intra-articular calcification at the anterior aspect of the tibial eminence which closely approximates the tibial attachment site of the anterior cruciate ligament as well as the anterior root attachment site of the lateral meniscus (series 21, image 39). No well-defined fracture is seen. No malalignment. Mild lateral compartment joint space narrowing. Ligaments Suboptimally assessed by CT. Muscles and Tendons Musculotendinous structures appear unremarkable by CT. Soft tissues No focal soft tissue swelling or fluid collection. IMPRESSION: 1. Large knee joint effusion without layering lipohemarthrosis. 2. There is a 4 mm linear intra-articular calcification at the anterior aspect of the tibial eminence which closely approximates the tibial attachment site of the anterior cruciate ligament as well as the anterior root attachment site of the lateral meniscus. This may be secondary to an avulsion injury of one of the structures in the setting of trauma. Orthopedic surgery referral with consideration for nonemergent MRI is recommended to assess for internal derangement. 3. Otherwise, no evidence of fracture. 4. Mild lateral compartment joint space narrowing. Electronically Signed   By: Duanne Guess D.O.   On: 10/12/2023 08:14   CT HEAD WO CONTRAST ( ) Result Date: 10/12/2023 CLINICAL DATA:  63 year old female with history of new onset of headache. EXAM: CT HEAD WITHOUT CONTRAST TECHNIQUE: Contiguous axial images were obtained from the base of the skull through the vertex  without intravenous contrast. RADIATION DOSE REDUCTION: This exam was performed according to the departmental dose-optimization program which includes automated exposure control, adjustment of the mA and/or kV according to patient size and/or use of iterative reconstruction technique. COMPARISON:  Head CT 01/14/2015. FINDINGS: Brain: No evidence of acute infarction, hemorrhage, hydrocephalus, extra-axial collection or mass lesion/mass effect. Vascular: No hyperdense vessel or unexpected calcification. Skull: Normal. Negative for fracture or focal lesion. Sinuses/Orbits: Mucosal thickening and large amount of frothy secretions noted within the right maxillary sinus. Other: None. IMPRESSION: 1. No acute intracranial abnormalities. 2. The appearance of the right maxillary sinus could indicate acute sinusitis. Electronically Signed   By: Trudie Reed M.D.   On: 10/12/2023 06:14   DG Knee Complete 4 Views Left Result Date: 10/11/2023 CLINICAL DATA:  Fall EXAM: LEFT KNEE - COMPLETE 4+ VIEW COMPARISON:  08/10/2023 FINDINGS: Small suprapatellar effusion. No fracture or dislocation. Mild patellofemoral osteoarthrosis. IMPRESSION: Small suprapatellar effusion without fracture or dislocation. Electronically Signed   By: Deatra Robinson M.D.   On: 10/11/2023 19:44     Subjective: - no chest pain, shortness of breath, no abdominal pain, nausea or vomiting.  Wants to go home  Discharge Exam: BP 109/61   Pulse (!) 59   Temp 98.6 F (37 C)   Resp 18   Ht 5\' 7"  (1.702 m)   Wt 76.2 kg   SpO2 92%   BMI 26.31 kg/m   General: Pt is alert, awake, not in acute distress Cardiovascular: RRR, S1/S2 +, no rubs, no gallops Respiratory:  CTA bilaterally, no wheezing, no rhonchi Abdominal: Soft, NT, ND, bowel sounds +   The results of significant diagnostics from this hospitalization (including imaging, microbiology, ancillary and laboratory) are listed below for reference.     Microbiology: No results found for  this or any previous visit (from the past 240 hours).   Labs: Basic Metabolic Panel: Recent Labs  Lab 10/11/23 1951 10/12/23 0630  NA 135 136  K 3.2* 3.3*  CL 99 102  CO2 22 24  GLUCOSE 137* 112*  BUN 41* 35*  CREATININE 3.47* 2.46*  CALCIUM 9.7 9.7  MG  --  2.3   Liver Function Tests: Recent Labs  Lab 10/12/23 0630  AST 18  ALT 16  ALKPHOS 53  BILITOT 0.6  PROT 7.1  ALBUMIN 3.7   CBC: Recent Labs  Lab 10/11/23 1951 10/12/23 0630  WBC 14.0* 11.4*  NEUTROABS 11.0* 7.6  HGB 11.3* 10.7*  HCT 34.6* 32.3*  MCV 95.6 95.8  PLT 233 221   CBG: No results for input(s): "GLUCAP" in the last 168 hours. Hgb A1c No results for input(s): "HGBA1C" in the last 72 hours. Lipid Profile No results for input(s): "CHOL", "HDL", "LDLCALC", "TRIG", "CHOLHDL", "LDLDIRECT" in the last 72 hours. Thyroid function studies No results for input(s): "TSH", "T4TOTAL", "T3FREE", "THYROIDAB" in the last 72 hours.  Invalid input(s): "FREET3" Urinalysis    Component Value Date/Time   COLORURINE YELLOW 08/14/2018 0713   APPEARANCEUR HAZY (A) 08/14/2018 0713   LABSPEC 1.008 08/14/2018 0713   PHURINE 6.0 08/14/2018 0713   GLUCOSEU NEGATIVE 08/14/2018 0713   HGBUR SMALL (A) 08/14/2018 0713   BILIRUBINUR NEGATIVE 08/14/2018 0713   KETONESUR NEGATIVE 08/14/2018 0713   PROTEINUR NEGATIVE 08/14/2018 0713   UROBILINOGEN 0.2 09/28/2007 1102   NITRITE NEGATIVE 08/14/2018 0713   LEUKOCYTESUR LARGE (A) 08/14/2018 0713    FURTHER DISCHARGE INSTRUCTIONS:   Get Medicines reviewed and adjusted: Please take all your medications with you for your next visit with your Primary MD   Laboratory/radiological data: Please request your Primary MD to go over all hospital tests and procedure/radiological results at the follow up, please ask your Primary MD to get all Hospital records sent to his/her office.   In some cases, they will be blood work, cultures and biopsy results pending at the time of your  discharge. Please request that your primary care M.D. goes through all the records of your hospital data and follows up on these results.   Also Note the following: If you experience worsening of your admission symptoms, develop shortness of breath, life threatening emergency, suicidal or homicidal thoughts you must seek medical attention immediately by calling 911 or calling your MD immediately  if symptoms less severe.   You must read complete instructions/literature along with all the possible adverse reactions/side effects for all the Medicines you take and that have been prescribed to you. Take any new Medicines after you have completely understood and accpet all the possible adverse reactions/side effects.    Do not drive when taking Pain medications or sleeping medications (Benzodaizepines)   Do not take more than prescribed Pain, Sleep and Anxiety Medications. It is not advisable to combine anxiety,sleep and pain medications without talking with your primary care practitioner   Special Instructions: If you have smoked or chewed Tobacco  in the last 2 yrs please stop smoking, stop any regular Alcohol  and or any Recreational drug use.   Wear Seat belts while driving.   Please note: You were cared  for by a hospitalist during your hospital stay. Once you are discharged, your primary care physician will handle any further medical issues. Please note that NO REFILLS for any discharge medications will be authorized once you are discharged, as it is imperative that you return to your primary care physician (or establish a relationship with a primary care physician if you do not have one) for your post hospital discharge needs so that they can reassess your need for medications and monitor your lab values.  Time coordinating discharge: 40 minutes  SIGNED:  Pamella Pert, MD, PhD 10/12/2023, 9:28 AM

## 2023-10-12 NOTE — ED Notes (Signed)
Provider made aware of vitals. New orders provided and vitals updated in flowsheet

## 2024-07-06 ENCOUNTER — Other Ambulatory Visit: Payer: Self-pay

## 2024-07-06 ENCOUNTER — Emergency Department (HOSPITAL_COMMUNITY)

## 2024-07-06 ENCOUNTER — Encounter (HOSPITAL_COMMUNITY): Payer: Self-pay | Admitting: *Deleted

## 2024-07-06 ENCOUNTER — Emergency Department (HOSPITAL_COMMUNITY)
Admission: EM | Admit: 2024-07-06 | Discharge: 2024-07-06 | Discharge status: Against Medical Advice | Source: Ambulatory Visit | Attending: Emergency Medicine | Admitting: Emergency Medicine

## 2024-07-06 DIAGNOSIS — M79604 Pain in right leg: Secondary | ICD-10-CM | POA: Diagnosis not present

## 2024-07-06 DIAGNOSIS — M25571 Pain in right ankle and joints of right foot: Secondary | ICD-10-CM | POA: Diagnosis not present

## 2024-07-06 DIAGNOSIS — M79603 Pain in arm, unspecified: Secondary | ICD-10-CM

## 2024-07-06 DIAGNOSIS — M79602 Pain in left arm: Secondary | ICD-10-CM | POA: Insufficient documentation

## 2024-07-06 DIAGNOSIS — Z5329 Procedure and treatment not carried out because of patient's decision for other reasons: Secondary | ICD-10-CM | POA: Insufficient documentation

## 2024-07-06 MED ORDER — OXYCODONE-ACETAMINOPHEN 5-325 MG PO TABS
2.0000 | ORAL_TABLET | Freq: Once | ORAL | Status: AC
  Administered 2024-07-06: 2 via ORAL
  Filled 2024-07-06: qty 2

## 2024-07-06 NOTE — ED Provider Triage Note (Signed)
 Emergency Medicine Provider Triage Evaluation Note  Amanda Moody , a 63 y.o. female  was evaluated in triage.  Pt complains of was sent in by PCP for DVT studies as she has had arm and leg swelling  Review of Systems  Positive: Leg swelling, arm swelling Negative: SOB, chest pain   Physical Exam  BP (!) 119/59   Pulse (!) 50   Temp 97.7 F (36.5 C)   Resp 14   Ht 5' 7 (1.702 m)   Wt 79.8 kg   SpO2 97%   BMI 27.57 kg/m  Gen:   Awake, no distress   Resp:  Normal effort  MSK:   Moves extremities without difficulty    Medical Decision Making  Medically screening exam initiated at 9:34 AM.  Appropriate orders placed.  Amanda Moody was informed that the remainder of the evaluation will be completed by another provider, this initial triage assessment does not replace that evaluation, and the importance of remaining in the ED until their evaluation is complete.     Gennaro Duwaine CROME, DO 07/06/24 [phone]

## 2024-07-06 NOTE — ED Notes (Addendum)
 Patient states she would like to leave cause her niece wants to go home. She states it is ridiculous that she has to wait this long for an ultrasound, since 9am. EDP notified of patient wanting to leave AMA. Pt agreeable to sign AMA form, e-signature signed. Pt ambulatory at discharge, in no acute distress.

## 2024-07-06 NOTE — ED Provider Notes (Signed)
 [place] EMERGENCY DEPARTMENT AT [facility] Provider Note   CSN: [id number] Arrival date & time: 07/06/24  9095     Patient presents with: No chief complaint on file.   Amanda Moody is a 63 y.o. female.   63 year old female presents with pain to her left arm and right leg.  Pain has been atraumatic and concern for possible DVT after patient saw her PCP last Friday.  Is being treated for a possible infection at her left hand.  Patient notes that she has chronic pain to left upper extremity from prior surgical fixation of her fracture.  She denies any fevers at this time.  She has not been short of breath.  Denies any chest pain.       Prior to Admission medications   Medication Sig Start Date End Date Taking? Authorizing Provider  amLODipine  (NORVASC ) 5 MG tablet Take 1 tablet (5 mg total) by mouth daily. 10/12/23 10/11/24  Gherghe, Costin M, MD  clonazePAM  (KLONOPIN ) 0.5 MG tablet Take 0.5 mg by mouth 2 (two) times daily as needed. 10/10/23   [provider]  FLUoxetine  (PROZAC ) 40 MG capsule Take 40 mg by mouth daily. 09/16/21   [provider]  LORazepam  (ATIVAN ) 1 MG tablet Take 0.5-1 mg by mouth 2 (two) times daily as needed. 08/10/23   [provider]  methadone  (DOLOPHINE ) 10 MG tablet Take 140 mg by mouth daily.    [provider]    Allergies: Flexeril [cyclobenzaprine hcl]    Review of Systems  All other systems reviewed and are negative.   Updated Vital Signs BP (!) 119/59   Pulse (!) 50   Temp 97.7 F (36.5 C)   Resp 14   Ht 1.702 m (5' 7)   Wt 79.8 kg   SpO2 97%   BMI 27.57 kg/m   Physical Exam Vitals and nursing note reviewed.  Constitutional:      General: She is not in acute distress.    Appearance: Normal appearance. She is well-developed. She is not toxic-appearing.  HENT:     Head: Normocephalic and atraumatic.  Eyes:     General: Lids are normal.     Conjunctiva/sclera: Conjunctivae normal.      Pupils: Pupils are equal, round, and reactive to light.  Neck:     Thyroid: No thyroid mass.     Trachea: No tracheal deviation.  Cardiovascular:     Rate and Rhythm: Normal rate and regular rhythm.     Heart sounds: Normal heart sounds. No murmur heard.    No gallop.  Pulmonary:     Effort: Pulmonary effort is normal. No respiratory distress.     Breath sounds: Normal breath sounds. No stridor. No decreased breath sounds, wheezing, rhonchi or rales.  Abdominal:     General: There is no distension.     Palpations: Abdomen is soft.     Tenderness: There is no abdominal tenderness. There is no rebound.  Musculoskeletal:        General: No tenderness. Normal range of motion.     Cervical back: Normal range of motion and neck supple.     Comments: Left radial pulses 2+.  No areas of erythema or crepitus noted at the left upper extremity.  Right lower extremity shows full range of motion at the right hip and knee.  Slight pain to palpation of the right ankle.  No calf tenderness.  Skin:    General: Skin is warm and dry.  Findings: No abrasion or rash.  Neurological:     Mental Status: She is alert and oriented to person, place, and time. Mental status is at baseline.     GCS: GCS eye subscore is 4. GCS verbal subscore is 5. GCS motor subscore is 6.     Cranial Nerves: No cranial nerve deficit.     Sensory: No sensory deficit.     Motor: Motor function is intact.  Psychiatric:        Attention and Perception: Attention normal.        Speech: Speech normal.        Behavior: Behavior normal.     (all labs ordered are listed, but only abnormal results are displayed) Labs Reviewed - No data to display  EKG: None  Radiology: No results found.   Procedures   Medications Ordered in the ED  oxyCODONE -acetaminophen  (PERCOCET/ROXICET) 5-325 MG per tablet 2 tablet (has no administration in time range)                                    Medical Decision  Making Risk Prescription drug management.   Pain medication ordered and Doppler of upper and lower extremity is pending at this time.  Informed by nursing that patient is leaving AMA     Final diagnoses:  None    ED Discharge Orders     None          Dasie Faden, MD 07/06/24 1204

## 2024-07-06 NOTE — ED Triage Notes (Signed)
 States last wed swelling left arm and right knee and ankle went to her PCP Friday was given antibiotics  was told to come to ED fri was too scared to come , states the pain is worse today

## 2024-07-29 ENCOUNTER — Emergency Department (HOSPITAL_COMMUNITY)

## 2024-07-29 ENCOUNTER — Emergency Department (HOSPITAL_COMMUNITY)
Admission: EM | Admit: 2024-07-29 | Discharge: 2024-07-29 | Disposition: A | Discharge status: Home / Self Care | Attending: Emergency Medicine | Admitting: Emergency Medicine

## 2024-07-29 ENCOUNTER — Emergency Department (EMERGENCY_DEPARTMENT_HOSPITAL): Admit: 2024-07-29 | Discharge: 2024-07-29 | Disposition: A

## 2024-07-29 DIAGNOSIS — M79602 Pain in left arm: Secondary | ICD-10-CM | POA: Diagnosis not present

## 2024-07-29 DIAGNOSIS — Z720 Tobacco use: Secondary | ICD-10-CM | POA: Diagnosis not present

## 2024-07-29 DIAGNOSIS — G8929 Other chronic pain: Secondary | ICD-10-CM

## 2024-07-29 DIAGNOSIS — I10 Essential (primary) hypertension: Secondary | ICD-10-CM | POA: Diagnosis not present

## 2024-07-29 MED ORDER — OXYCODONE-ACETAMINOPHEN 5-325 MG PO TABS
1.0000 | ORAL_TABLET | Freq: Once | ORAL | Status: AC
  Administered 2024-07-29: 1 via ORAL
  Filled 2024-07-29: qty 1

## 2024-07-29 NOTE — ED Triage Notes (Signed)
 Patient in by POV with c/o left arm pain that started about 2 weeks ago. Denies any trauma.  Patient states she had a surgery on it 20 years ago and thinks the pins have came out. Active ROM noted.

## 2024-07-29 NOTE — Progress Notes (Signed)
 Left upper extremity venous duplex has been completed. Preliminary results can be found in CV Proc through chart review.  Results were given to [facility] PA.  07/29/24 9:11 AM Cathlyn Collet RVT

## 2024-07-29 NOTE — Discharge Instructions (Addendum)
 Your workup today has been reassuring without a blood clot or fracture or abnormal finding please follow-up with an orthopedic doctor and given you the information for [facility].  You can also follow-up with your primary care doctor.  Please use Tylenol  or ibuprofen for pain.  You may use 600 mg ibuprofen every 6 hours or 1000 mg of Tylenol  every 6 hours.  You may choose to alternate between the 2.  This would be most effective.  Not to exceed 4 g of Tylenol  within 24 hours.  Not to exceed 3200 mg ibuprofen 24 hours.

## 2024-07-29 NOTE — ED Provider Notes (Signed)
 Patient care assumed at shift change pending ultrasound of left upper extremity and x-ray   Physical Exam  BP 128/79   Pulse (!) 57   Temp 97.9 F (36.6 C) (Oral)   Resp 18   Ht 5' 7 (1.702 m)   Wt 80.7 kg   SpO2 96%   BMI 27.88 kg/m   Physical Exam  Procedures  Procedures  ED Course / MDM    Medical Decision Making Amount and/or Complexity of Data Reviewed Radiology: ordered.  Risk Prescription drug management.   X-ray and ultrasound unremarkable will have patient follow-up with orthopedics and primary care.  Distally neuro vastly intact.  Bilateral radial artery pulses symmetric, sensation normal in bilateral hands full range of motion of fingers and normal flexion extension at wrist and elbow       Neldon Hamp RAMAN, PA 07/29/24 1006    Mannie Pac T, DO 07/31/24 0845

## 2024-07-29 NOTE — ED Notes (Signed)
 Pt left the ER on her own accord without discharge paperwork.

## 2024-07-29 NOTE — ED Provider Notes (Signed)
 [place] EMERGENC[facility] DEPARTMENT AT [facility] Provider Note   CSN: [id number] Arrival date & time: 07/29/24  9444     Patient presents with: Extremity Pain   Amanda Moody is a 63 y.o. female.   63 year old female presenting with arm pain.  Patient notes chronic left elbow pain that has been worsening over the past several weeks, she has a history of a prior surgery to her elbow and does have hardware in place, she believes that she is having pain due to a loose screw.  She was seen by her primary care provider for this several weeks ago, at that time her PCP was concerned for a possible left upper extremity DVT, given the fact that the patient had notable swelling of her left hand as well as an elevated D-dimer in the office at 6.23.  Patient was then seen in the emergency department and an ultrasound of her left upper extremity was ordered, however she left before this ultrasound could be completed.  She denies any new injury or inciting event, she notes improvement in the swelling of her left hand however it still does swell from time to time.  She was previously treated for cellulitis in this extremity and completed the antibiotics without issue, she is left-hand-dominant.  No history of PE/DVT.  No chest pain or shortness of breath.   Extremity Pain       Prior to Admission medications   Medication Sig Start Date End Date Taking? Authorizing Provider  amLODipine  (NORVASC ) 5 MG tablet Take 1 tablet (5 mg total) by mouth daily. 10/12/23 10/11/24  Gherghe, Costin M, MD  clonazePAM  (KLONOPIN ) 0.5 MG tablet Take 0.5 mg by mouth 2 (two) times daily as needed. 10/10/23   [provider]  FLUoxetine  (PROZAC ) 40 MG capsule Take 40 mg by mouth daily. 09/16/21   [provider]  LORazepam  (ATIVAN ) 1 MG tablet Take 0.5-1 mg by mouth 2 (two) times daily as needed. 08/10/23   [provider]  methadone  (DOLOPHINE ) 10 MG tablet Take 140 mg by mouth daily.     [provider]    Allergies: Flexeril [cyclobenzaprine hcl]    Review of Systems  Updated Vital Signs  Vitals:   07/29/24 0604 07/29/24 0606  BP: (!) 146/78   Pulse: (!) 59   Resp: 17   Temp: 97.8 F (36.6 C)   TempSrc: Oral   SpO2: 100%   Weight:  80.7 kg  Height:  5' 7 (1.702 m)     Physical Exam Vitals and nursing note reviewed.  HENT:     Head: Normocephalic.  Eyes:     Extraocular Movements: Extraocular movements intact.  Cardiovascular:     Rate and Rhythm: Normal rate.  Pulmonary:     Effort: Pulmonary effort is normal.  Musculoskeletal:     Cervical back: Normal range of motion.     Comments: LUE: Active and passive flexion at the elbow is limited secondary to pain. There is an ~8cm linear surgical scar extending from the olecranon distally, tenderness to palpation noted at the proximal aspect of the scar nearing the olecranon. No erythema/swelling/warmth of the olecranon. Full active and passive ROM at the wrist, grip strength is diminished. Mild soft tissue swelling noted to the dorsal hand at the distal MCP of the 2nd and 3rd digit, no appreciable warmth. 2+ radial pulse. No sensory deficits.   Skin:    General: Skin is warm and dry.  Neurological:     Mental  Status: She is alert and oriented to person, place, and time.     (all labs ordered are listed, but only abnormal results are displayed) Labs Reviewed - No data to display  EKG: None  Radiology: No results found.   Procedures   Medications Ordered in the ED  oxyCODONE -acetaminophen  (PERCOCET/ROXICET) 5-325 MG per tablet 1 tablet (has no administration in time range)                                    Medical Decision Making This patient presents to the ED for concern of left arm pain, this involves an extensive number of treatment options, and is a complaint that carries with it a high risk of complications and morbidity.  The differential diagnosis includes fracture,  dislocation, malfunction of previously placed hardware, DVT   Co morbidities that complicate the patient evaluation  Hypertension   Additional history obtained:  Additional history obtained from record review External records from outside source obtained and reviewed including previous ED note and PCP note   Imaging Studies ordered:  I ordered imaging studies including L elbow XR and LUE doppler US   Imaging results pending at time of shift change   Cardiac Monitoring: / EKG:  The patient was maintained on a cardiac monitor.  I personally viewed and interpreted the cardiac monitored which showed an underlying rhythm of: sinus bradycardia   Problem List / ED Course / Critical interventions / Medication management  I ordered medication including Percocet  for pain     Social Determinants of Health:  Tobacco use, financial instability, unmet transportation needs   Test / Admission - Considered:  Physical exam is notable as above, patient with significant surgical history to this extremity, concern for displaced hardware as contributing to her pain, will evaluate further with x-ray.  Patient does have some minimal swelling to the dorsal aspect of the left hand as noted above with diminished grip strength, patient was previously seen for this same issue and there was a concern that she was showing signs consistent with a possible DVT, an ultrasound was ordered at that time but patient left before this could be completed.  Her PCP was also concerned for DVT and ordered a D-dimer in the outpatient setting, this was notably elevated at 6.23.  Given persistent swelling and pain in the hand as well as the elbow, I do feel that it is necessary to proceed with a left upper extremity Doppler ultrasound to further evaluate for possible DVT.  No concern for septic joint or cellulitis based on physical exam as above. Low suspicion for PE, patient does not have chest pain or shortness of breath.    Xray and US  results pending at time of shift change. Patient handed off to oncoming PA-C [place], see their note for information regarding assessment/plan/dispo of this patient.     Amount and/or Complexity of Data Reviewed Radiology: ordered.  Risk Prescription drug management.        Final diagnoses:  None    ED Discharge Orders     None          Glendia Rocky SAILOR, [place] 07/29/24 [id number]    Jerral Meth, MD 07/29/24 [phone]
